# Patient Record
Sex: Male | Born: 1985 | Race: Black or African American | Hispanic: No | Marital: Married | Smoking: Never smoker
Health system: Southern US, Community
[De-identification: ages and names within clinical notes are randomized; demographics above are authoritative.]

## PROBLEM LIST (undated history)

## (undated) DIAGNOSIS — Z87442 Personal history of urinary calculi: Secondary | ICD-10-CM

## (undated) DIAGNOSIS — R519 Headache, unspecified: Secondary | ICD-10-CM

## (undated) DIAGNOSIS — J45909 Unspecified asthma, uncomplicated: Secondary | ICD-10-CM

## (undated) HISTORY — PX: NO PAST SURGERIES: SHX2092

## (undated) HISTORY — DX: Headache, unspecified: R51.9

---

## 2018-09-29 ENCOUNTER — Encounter (HOSPITAL_COMMUNITY): Payer: Self-pay | Admitting: Emergency Medicine

## 2018-09-29 ENCOUNTER — Emergency Department (HOSPITAL_COMMUNITY): Payer: BC Managed Care – PPO

## 2018-09-29 ENCOUNTER — Other Ambulatory Visit: Payer: Self-pay

## 2018-09-29 ENCOUNTER — Emergency Department (HOSPITAL_COMMUNITY)
Admission: EM | Admit: 2018-09-29 | Discharge: 2018-09-30 | Disposition: A | Discharge status: Home / Self Care | Payer: BC Managed Care – PPO | Attending: Emergency Medicine | Admitting: Emergency Medicine

## 2018-09-29 DIAGNOSIS — N201 Calculus of ureter: Secondary | ICD-10-CM | POA: Insufficient documentation

## 2018-09-29 DIAGNOSIS — R109 Unspecified abdominal pain: Secondary | ICD-10-CM | POA: Diagnosis present

## 2018-09-29 MED ORDER — SODIUM CHLORIDE 0.9 % IV BOLUS
1000.0000 mL | Freq: Once | INTRAVENOUS | Status: AC
  Administered 2018-09-29: 1000 mL via INTRAVENOUS

## 2018-09-29 MED ORDER — KETOROLAC TROMETHAMINE 30 MG/ML IJ SOLN
30.0000 mg | Freq: Once | INTRAMUSCULAR | Status: AC
  Administered 2018-09-29: 30 mg via INTRAVENOUS
  Filled 2018-09-29: qty 1

## 2018-09-29 MED ORDER — ONDANSETRON HCL 4 MG/2ML IJ SOLN
4.0000 mg | Freq: Once | INTRAMUSCULAR | Status: AC
  Administered 2018-09-29: 4 mg via INTRAVENOUS
  Filled 2018-09-29: qty 2

## 2018-09-29 MED ORDER — MORPHINE SULFATE (PF) 4 MG/ML IV SOLN
4.0000 mg | Freq: Once | INTRAVENOUS | Status: AC
  Administered 2018-09-29: 23:00:00 4 mg via INTRAVENOUS
  Filled 2018-09-29: qty 1

## 2018-09-29 NOTE — ED Triage Notes (Signed)
Patient from home with c/o severe lower back pain. 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 Describes pain as dull since this morning and now it is sharp and much more painful. No urinary issues. Hx of kidney stones and back spasms

## 2018-09-29 NOTE — ED Provider Notes (Signed)
[name] Provider Note   CSN: [id number] Arrival date & time: 09/29/18  2244    History   Chief Complaint Chief Complaint  Patient presents with  . Back Pain    HPI Seth Vance is a 33 y.o. male with a history of kidney stones and back spasms who presents to the emergency department with a chief complaint of left flank pain.  The patient endorses sudden onset, constant, worsening left flank pain, onset at 20:30.  He characterizes the pain as sharp and crampy.  He has been nauseated and actively started vomiting while I was in the room.  No fever, chills, chest pain, shortness of breath, diarrhea, constipation, diarrhea, or urinary complaints.  No recent falls or injuries.  He took Aleve for his symptoms earlier tonight without improvement.      The history is provided by the patient. No language interpreter was used.    History reviewed. No pertinent past medical history.  There are no active problems to display for this patient.   History reviewed. No pertinent surgical history.      Home Medications    Prior to Admission medications   Medication Sig Start Date End Date Taking? Authorizing Provider  ondansetron (ZOFRAN ODT) 4 MG disintegrating tablet Take 1 tablet (4 mg total) by mouth every 8 (eight) hours as needed. 09/30/18   Camia Dipinto A, PA-C  oxyCODONE-acetaminophen (PERCOCET/ROXICET) 5-325 MG tablet Take 2 tablets by mouth every 6 (six) hours as needed for severe pain. 09/30/18   Aneshia Jacquet A, PA-C  tamsulosin (FLOMAX) 0.4 MG CAPS capsule Take 1 capsule (0.4 mg total) by mouth daily for 14 days. 09/30/18 10/14/18  Keionte Swicegood, Coral ElseMia A, PA-C    Family History History reviewed. No pertinent family history.  Social History Social History   Tobacco Use  . Smoking status: Never Smoker  . Smokeless tobacco: Never Used  Substance Use Topics  . Alcohol use: Yes  . Drug use: Never     Allergies   Patient has no allergy  information on record.   Review of Systems Review of Systems  Constitutional: Negative for appetite change, chills and fever.  Respiratory: Negative for shortness of breath.   Cardiovascular: Negative for chest pain.  Gastrointestinal: Positive for nausea and vomiting. Negative for abdominal pain, constipation and diarrhea.  Genitourinary: Positive for flank pain. Negative for dysuria, frequency, hematuria and urgency.  Musculoskeletal: Negative for back pain.  Skin: Negative for rash.  Allergic/Immunologic: Negative for immunocompromised state.  Neurological: Negative for dizziness, weakness, numbness and headaches.  Psychiatric/Behavioral: Negative for confusion.     Physical Exam Updated Vital Signs BP 127/88   Pulse 62   Temp 97.7 F (36.5 C) (Oral)   Resp 16   Ht 5\' 5"  (1.651 m)   Wt 84.4 kg   SpO2 100%   BMI 30.95 kg/m   Physical Exam Vitals signs and nursing note reviewed.  Constitutional:      Appearance: He is well-developed.  HENT:     Head: Normocephalic.  Eyes:     Conjunctiva/sclera: Conjunctivae normal.  Neck:     Musculoskeletal: Neck supple.  Cardiovascular:     Rate and Rhythm: Normal rate and regular rhythm.     Heart sounds: No murmur.  Pulmonary:     Effort: Pulmonary effort is normal. No respiratory distress.     Breath sounds: No stridor. No wheezing, rhonchi or rales.  Chest:     Chest wall: No tenderness.  Abdominal:  General: There is no distension.     Palpations: Abdomen is soft.     Tenderness: There is abdominal tenderness.     Comments: Tender to palpation over the left CVA, left lower quadrant, and suprapubic region.  No rebound or guarding.  Abdomen soft, nondistended.  Normoactive bowel sounds.  No right CVA tenderness or right-sided abdominal tenderness.   Skin:    General: Skin is warm and dry.     Capillary Refill: Capillary refill takes 2 to 3 seconds.  Neurological:     Mental Status: He is alert.  Psychiatric:         Behavior: Behavior normal.      ED Treatments / Results  Labs (all labs ordered are listed, but only abnormal results are displayed) Labs Reviewed  URINALYSIS, ROUTINE W REFLEX MICROSCOPIC - Abnormal; Notable for the following components:      Result Value   APPearance CLOUDY (*)    Hgb urine dipstick LARGE (*)    Protein, ur 100 (*)    RBC / HPF >50 (*)    All other components within normal limits    EKG None  Radiology Ct Renal Stone Study  Result Date: 09/29/2018 CLINICAL DATA:  Left flank pain EXAM: CT ABDOMEN AND PELVIS WITHOUT CONTRAST TECHNIQUE: Multidetector CT imaging of the abdomen and pelvis was performed following the standard protocol without IV contrast. COMPARISON:  None. FINDINGS: Lower chest: Lung bases are clear. No effusions. Heart is normal size. Hepatobiliary: No focal hepatic abnormality. Gallbladder unremarkable. Pancreas: No focal abnormality or ductal dilatation. Spleen: No focal abnormality.  Normal size. Adrenals/Urinary Tract: 5 mm proximal left ureteral stone with mild left hydronephrosis. No renal or ureteral stones on the right. Adrenal glands and urinary bladder unremarkable. Stomach/Bowel: Normal appendix. Stomach, large and small bowel grossly unremarkable. Vascular/Lymphatic: No evidence of aneurysm or adenopathy. Reproductive: Central prostate calcifications. Other: No free fluid or free air. Musculoskeletal: No acute bony abnormality. IMPRESSION: 5 mm proximal left ureteral stone with left hydronephrosis. Electronically Signed   By: Rolm Baptise M.D.   On: 09/29/2018 23:21    Procedures Procedures (including critical care time)  Medications Ordered in ED Medications  ondansetron (ZOFRAN) injection 4 mg (4 mg Intravenous Given 09/29/18 2304)  morphine 4 MG/ML injection 4 mg (4 mg Intravenous Given 09/29/18 2304)  ketorolac (TORADOL) 30 MG/ML injection 30 mg (30 mg Intravenous Given 09/29/18 2336)  sodium chloride 0.9 % bolus 1,000 mL (0 mLs  Intravenous Stopped 09/30/18 0046)     Initial Impression / Assessment and Plan / ED Course  I have reviewed the triage vital signs and the nursing notes.  Pertinent labs & imaging results that were available during my care of the patient were reviewed by me and considered in my medical decision making (see chart for details).        33 year old male with history of back spasms and kidney stones presenting with left flank pain and nausea.  The patient began actively vomiting while I was in the room.  Zofran and morphine given.  On exam, the patient has a left CVA tenderness and left lower abdominal tenderness.  Will order urinalysis and CT stone study and plan to reassess.  CT with 5 mm proximal left ureteral stone with left hydronephrosis.  -11:32 on reevaluation, patient reports minimal improvement in pain.  Will order Toradol and IV fluids.  UA is pending.  Discussed with the patient that if UA is not concerning for infection and pain is able  to be controlled then we can discharge the patient home with pain medication and urology follow-up.  If urine is concerning for infection or if we are unable to appropriately control the patient's pain that he will likely require a urology consult.  All questions answered and patient is agreeable with plan at this time.  -00:35 patient recheck.  Patient reports pain is currently at a 1 out of 10 and he is very comfortable.  He has been tolerating fluids without vomiting.  He is feeling much better and would like to be discharged.  We will discharge the patient with Flomax, urine strainer, and pain medication.  He is advised to follow-up with urology if stone does not pass in the next 5 to 7 days.  He was also advised if he is unable to control pain at home and he needs to return to the ER [facility] given access to urology services.  A 5820-month prescription history query was performed using the [place] CSRS prior to discharge. All  questions answered.  He is hemodynamically stable and in no acute distress.  Safe for discharge to home with outpatient follow-up.  Final Clinical Impressions(s) / ED Diagnoses   Final diagnoses:  Ureterolithiasis    ED Discharge Orders         Ordered    oxyCODONE-acetaminophen (PERCOCET/ROXICET) 5-325 MG tablet  Every 6 hours PRN     09/30/18 0050    ondansetron (ZOFRAN ODT) 4 MG disintegrating tablet  Every 8 hours PRN     09/30/18 0050    tamsulosin (FLOMAX) 0.4 MG CAPS capsule  Daily     09/30/18 0050           Frederik PearMcDonald, Shell Blanchette A, PA-C 09/30/18 0139    Mesner, Barbara CowerJason, MD 09/30/18 [id number]

## 2018-09-30 LAB — URINALYSIS, ROUTINE W REFLEX MICROSCOPIC
Bacteria, UA: NONE SEEN
Bilirubin Urine: NEGATIVE
Glucose, UA: NEGATIVE mg/dL
Ketones, ur: NEGATIVE mg/dL
Leukocytes,Ua: NEGATIVE
Nitrite: NEGATIVE
Protein, ur: 100 mg/dL — AB
RBC / HPF: >50 RBC/hpf — ABNORMAL HIGH (ref 0–5)
Specific Gravity, Urine: 1.019 (ref 1.005–1.030)
pH: 6 (ref 5.0–8.0)

## 2018-09-30 MED ORDER — OXYCODONE-ACETAMINOPHEN 5-325 MG PO TABS: 2.0000 | ORAL_TABLET | Freq: Four times a day (QID) | ORAL | 0 refills | Status: DC | PRN

## 2018-09-30 MED ORDER — ONDANSETRON 4 MG PO TBDP: 4.0000 mg | ORAL_TABLET | Freq: Three times a day (TID) | ORAL | 0 refills | Status: DC | PRN

## 2018-09-30 MED ORDER — TAMSULOSIN HCL 0.4 MG PO CAPS: 0.4000 mg | ORAL_CAPSULE | Freq: Every day | ORAL | 0 refills | Status: AC

## 2018-09-30 NOTE — Discharge Instructions (Addendum)
Thank you for allowing me to care for you today in the Emergency Department.   Your kidney stone is a 5 mm.  Sometimes stones that are the size can pass on their own, but not always.  If you do not pass the stone within the next 5 to 7 days, please call and schedule a follow-up appointment with [facility].  Take 1 tablet of Flomax daily to help facilitate passing the stone. Stop this medication when the stone passes.   To treat your pain at home, you can take Aleve as directed on the label or 600 mg of ibuprofen with food once every 6 hours.  Do not take both of these medications as they are both NSAIDs.  It is important to eat when you take these medications.  You can also safely take Tylenol with NSAIDs.  You can take 650 mg once every 6 hours.  Do not drink alcohol if you are taking Tylenol.  If your pain is unable to be controlled by this regimen, you can take 1 tablet of Percocet every 6 hours.  Percocet is a narcotic.  It can be addicting.  You should not work or drive while taking this medication.  You should also know the each tablet of Percocet contains 325 mg of Tylenol.  Do not take more than 4000 mg of Tylenol in a 24 hour period.   For nausea and vomiting, you can let 1 tablet of Zofran dissolve in your tongue every 8 hours as needed.  If the stone passes, use the urine strainer to catch it and you can take it to a follow-up appoint with your primary care provider or to [facility].   Return to the emergency department if your pain is uncontrollable, if you develop persistent vomiting despite taking Zofran, if you develop fever or chills.  I would also recommend going to [facility] ER if you develop the symptoms since you have a known kidney stone.

## 2018-09-30 NOTE — ED Notes (Signed)
Patient verbalizes understanding of discharge instructions. Opportunity for questioning and answers were provided. Armband removed by staff, pt discharged from ED.  

## 2018-10-03 ENCOUNTER — Encounter (HOSPITAL_COMMUNITY): Payer: Self-pay | Admitting: Emergency Medicine

## 2018-10-03 ENCOUNTER — Emergency Department (HOSPITAL_COMMUNITY): Payer: BC Managed Care – PPO

## 2018-10-03 ENCOUNTER — Other Ambulatory Visit: Payer: Self-pay

## 2018-10-03 ENCOUNTER — Emergency Department (HOSPITAL_COMMUNITY)
Admission: EM | Admit: 2018-10-03 | Discharge: 2018-10-03 | Disposition: A | Discharge status: Home / Self Care | Payer: BC Managed Care – PPO | Attending: Emergency Medicine | Admitting: Emergency Medicine

## 2018-10-03 DIAGNOSIS — R109 Unspecified abdominal pain: Secondary | ICD-10-CM | POA: Diagnosis present

## 2018-10-03 DIAGNOSIS — N201 Calculus of ureter: Secondary | ICD-10-CM | POA: Diagnosis not present

## 2018-10-03 DIAGNOSIS — N23 Unspecified renal colic: Secondary | ICD-10-CM | POA: Diagnosis not present

## 2018-10-03 NOTE — ED Provider Notes (Signed)
[facility]-EMERGENCY DEPT Provider Note   CSN: [id number] Arrival date & time: 10/03/18  1032    History   Chief Complaint Chief Complaint  Patient presents with  . Flank Pain    HPI Seth Vance is a 33 y.o. male.     HPI   Seth Vance is a 33 y.o. male, with a history of renal stone, presenting to the ED with left flank pain for the last 4 days.  Pain is aching and intermittently sharp/stabbing, moderate to severe, waxing and waning, radiating into the left lower back. Patient was seen in the ED for this same complaint on June 18, diagnosed with left proximal 5 mm ureteral stone with mild hydronephrosis, prescribed Percocet, and told to follow-up with urology. He took 1 Percocet yesterday and 1 this morning around 5:30 AM.  He presents to the ED today because his pain is not reduced as much as he thought it would be.  Denies fever/chills, N/V/D, dysuria, hematuria, difficulty urinating, abdominal pain, or any other complaints.   History reviewed. No pertinent past medical history.  There are no active problems to display for this patient.   History reviewed. No pertinent surgical history.      Home Medications    Prior to Admission medications   Medication Sig Start Date End Date Taking? Authorizing Provider  oxyCODONE-acetaminophen (PERCOCET/ROXICET) 5-325 MG tablet Take 2 tablets by mouth every 6 (six) hours as needed for severe pain. 09/30/18  Yes McDonald, Mia A, PA-C  tamsulosin (FLOMAX) 0.4 MG CAPS capsule Take 1 capsule (0.4 mg total) by mouth daily for 14 days. 09/30/18 10/14/18 Yes McDonald, Mia A, PA-C  ondansetron (ZOFRAN ODT) 4 MG disintegrating tablet Take 1 tablet (4 mg total) by mouth every 8 (eight) hours as needed. Patient not taking: Reported on 10/03/2018 09/30/18   Barkley BoardsMcDonald, Mia A, PA-C    Family History No family history on file.  Social History Social History   Tobacco Use  . Smoking status: Never Smoker  . Smokeless  tobacco: Never Used  Substance Use Topics  . Alcohol use: Yes  . Drug use: Never     Allergies   Shellfish allergy   Review of Systems Review of Systems  Constitutional: Negative for chills and fever.  Respiratory: Negative for shortness of breath.   Cardiovascular: Negative for chest pain.  Gastrointestinal: Negative for abdominal pain, diarrhea, nausea and vomiting.  Genitourinary: Positive for flank pain. Negative for decreased urine volume, difficulty urinating, dysuria, frequency, hematuria, penile pain, scrotal swelling and testicular pain.  Neurological: Negative for weakness.  All other systems reviewed and are negative.    Physical Exam Updated Vital Signs BP 128/82 (BP Location: Right Arm)   Pulse (!) 54   Temp 98.8 F (37.1 C) (Oral)   Resp 18   SpO2 100%   Physical Exam Vitals signs and nursing note reviewed.  Constitutional:      General: He is not in acute distress.    Appearance: He is well-developed. He is not diaphoretic.  HENT:     Head: Normocephalic and atraumatic.     Mouth/Throat:     Mouth: Mucous membranes are moist.     Pharynx: Oropharynx is clear.  Eyes:     Conjunctiva/sclera: Conjunctivae normal.  Neck:     Musculoskeletal: Neck supple.  Cardiovascular:     Rate and Rhythm: Normal rate and regular rhythm.     Pulses: Normal pulses.          Radial  pulses are 2+ on the right side and 2+ on the left side.       Posterior tibial pulses are 2+ on the right side and 2+ on the left side.     Comments: Tactile temperature in the extremities appropriate and equal bilaterally. Pulmonary:     Effort: Pulmonary effort is normal. No respiratory distress.  Abdominal:     Palpations: Abdomen is soft.     Tenderness: There is no abdominal tenderness. There is no right CVA tenderness, left CVA tenderness or guarding.  Musculoskeletal:     Right lower leg: No edema.     Left lower leg: No edema.  Lymphadenopathy:     Cervical: No cervical  adenopathy.  Skin:    General: Skin is warm and dry.  Neurological:     Mental Status: He is alert.  Psychiatric:        Mood and Affect: Mood and affect normal.        Speech: Speech normal.        Behavior: Behavior normal.      ED Treatments / Results  Labs (all labs ordered are listed, but only abnormal results are displayed) Labs Reviewed - No data to display  EKG None  Radiology Dg Abdomen 1 View  Result Date: 10/03/2018 CLINICAL DATA:  Acute left flank pain. EXAM: ABDOMEN - 1 VIEW COMPARISON:  CT scan of September 29, 2018. FINDINGS: The bowel gas pattern is normal. Grossly stable position of proximal left ureteral calculus is noted as noted on prior CT scan. Prostatic calcifications are again noted in the pelvis. IMPRESSION: Grossly stable position of proximal left ureteral calculus. Electronically Signed   By: Marijo Conception M.D.   On: 10/03/2018 13:30    Procedures Procedures (including critical care time)  Medications Ordered in ED Medications - No data to display   Initial Impression / Assessment and Plan / ED Course  I have reviewed the triage vital signs and the nursing notes.  Pertinent labs & imaging results that were available during my care of the patient were reviewed by me and considered in my medical decision making (see chart for details).  Clinical Course as of Oct 03 1426  Mon Oct 03, 2018  1237 Spoke with Dr. Jeffie Pollock, urologist.  States he will work on getting the patient seen in the office this week.  Requested a KUB, but otherwise no other imaging or labs are necessary.   [SJ]    Clinical Course User Index [SJ] Lorayne Bender, PA-C       Patient presents with continued flank pain associated with a left ureteral stone diagnosed June 18. Patient is nontoxic appearing, afebrile, not tachycardic, not tachypneic, not hypotensive, maintains excellent SPO2 on room air, and is in no apparent distress.  In fact, patient appears relaxed and comfortable  lying on the bed, playing on his phone, throughout his ED course. We discussed different dosing techniques for Percocet as well as adding NSAID.  Stone appears to be in grossly similar position on KUB today as it was on CT June 18.  He will follow-up with urology this week. The patient was given instructions for home care as well as return precautions. Patient voices understanding of these instructions, accepts the plan, and is comfortable with discharge.   Final Clinical Impressions(s) / ED Diagnoses   Final diagnoses:  Ureterolithiasis  Ureteral colic    ED Discharge Orders    None       Joy, Shawn  C, PA-C 10/03/18 1431    Virgina NorfolkCuratolo, Adam, DO 10/03/18 2156

## 2018-10-03 NOTE — ED Notes (Signed)
Patient transported to X-ray 

## 2018-10-03 NOTE — ED Triage Notes (Signed)
Pt reports was told last Thursday has kidney stone on left side and to go to [facility] ED for pain control. Pt reports took Percocet around 530am with very little relief. Denies urinary problems at this time.

## 2018-10-03 NOTE — Discharge Instructions (Signed)
°  Kidney Stone There is evidence of a kidney stone on the left side and the position appears to be stable. Some kidney stones can take up to 30 days to pass. Hydration: Hydration is key to helping a kidney stone pass.  Have a goal of half a liter of water every hour or two. Antiinflammatory medications: Take 600 mg of ibuprofen every 6 hours or 440 mg (over the counter dose) to 500 mg (prescription dose) of naproxen every 12 hours for the next 3 days. After this time, these medications may be used as needed for pain. Take these medications with food to avoid upset stomach. Choose only one of these medications, do not take them together. Acetaminophen: Should you continue to have additional pain while taking the ibuprofen or naproxen, you may add in acetaminophen (generic for Tylenol) as needed. Your daily total maximum amount of acetaminophen from all sources should be limited to 4000mg /day for persons without liver problems, or 2000mg /day for those with liver problems. Percocet: May take Percocet (oxycodone-acetaminophen) as needed for severe pain.   Do not drive or perform other dangerous activities while taking this medication as it can cause drowsiness as well as changes in reaction time and judgement.  Please note that each pill of Percocet contains 325 mg of acetaminophen (generic for Tylenol) and the above dosage limits apply. Start with 1 pill every 6 hours.  If 1 pill does not impact pain sufficiently, may take a second Percocet 15 to 20 minutes after the first one.  If effects of the Percocet wear off too quickly, may begin taking 1 pill every 4 hours. Tamsulosin: This medication is designed to help the stone pass.  Take this medication daily until stone passes. Follow-up: Follow-up with the urologist as soon as possible on this matter. Return: Return to the ED for significantly increased pain, difficulty urinating, pain with urination, fever, uncontrolled vomiting, or any other major  concerns.  For prescription assistance, may try using prescription discount sites or apps, such as goodrx.com

## 2018-10-21 ENCOUNTER — Other Ambulatory Visit: Payer: Self-pay | Admitting: Urology

## 2018-10-24 ENCOUNTER — Encounter (HOSPITAL_COMMUNITY): Payer: Self-pay | Admitting: General Practice

## 2018-10-27 ENCOUNTER — Encounter (HOSPITAL_COMMUNITY): Payer: Self-pay | Admitting: *Deleted

## 2018-10-27 ENCOUNTER — Ambulatory Visit (HOSPITAL_COMMUNITY)
Admission: RE | Admit: 2018-10-27 | Discharge: 2018-10-27 | Disposition: A | Discharge status: Home / Self Care | Payer: BC Managed Care – PPO | Attending: Urology | Admitting: Urology

## 2018-10-27 ENCOUNTER — Other Ambulatory Visit: Payer: Self-pay

## 2018-10-27 ENCOUNTER — Encounter (HOSPITAL_COMMUNITY)
Admission: RE | Disposition: A | Discharge status: Home / Self Care | Payer: Self-pay | Source: Home / Self Care | Attending: Urology

## 2018-10-27 ENCOUNTER — Ambulatory Visit (HOSPITAL_COMMUNITY): Payer: BC Managed Care – PPO

## 2018-10-27 DIAGNOSIS — Z87442 Personal history of urinary calculi: Secondary | ICD-10-CM | POA: Diagnosis not present

## 2018-10-27 DIAGNOSIS — F17290 Nicotine dependence, other tobacco product, uncomplicated: Secondary | ICD-10-CM | POA: Insufficient documentation

## 2018-10-27 DIAGNOSIS — N201 Calculus of ureter: Secondary | ICD-10-CM

## 2018-10-27 DIAGNOSIS — N132 Hydronephrosis with renal and ureteral calculous obstruction: Secondary | ICD-10-CM | POA: Diagnosis not present

## 2018-10-27 DIAGNOSIS — J45909 Unspecified asthma, uncomplicated: Secondary | ICD-10-CM | POA: Insufficient documentation

## 2018-10-27 HISTORY — DX: Personal history of urinary calculi: Z87.442

## 2018-10-27 HISTORY — PX: EXTRACORPOREAL SHOCK WAVE LITHOTRIPSY: SHX1557

## 2018-10-27 HISTORY — DX: Unspecified asthma, uncomplicated: J45.909

## 2018-10-27 SURGERY — LITHOTRIPSY, ESWL
Anesthesia: LOCAL | Laterality: Left

## 2018-10-27 MED ORDER — DIPHENHYDRAMINE HCL 25 MG PO CAPS
25.0000 mg | ORAL_CAPSULE | ORAL | Status: AC
  Administered 2018-10-27: 25 mg via ORAL
  Filled 2018-10-27: qty 1

## 2018-10-27 MED ORDER — SODIUM CHLORIDE 0.9 % IV SOLN
INTRAVENOUS | Status: DC
  Administered 2018-10-27: 10:00:00 via INTRAVENOUS

## 2018-10-27 MED ORDER — TRAMADOL HCL 50 MG PO TABS: 50.0000 mg | ORAL_TABLET | Freq: Four times a day (QID) | ORAL | 0 refills | Status: DC | PRN

## 2018-10-27 MED ORDER — DIAZEPAM 5 MG PO TABS
10.0000 mg | ORAL_TABLET | ORAL | Status: AC
  Administered 2018-10-27: 10 mg via ORAL
  Filled 2018-10-27: qty 2

## 2018-10-27 MED ORDER — CIPROFLOXACIN HCL 500 MG PO TABS
500.0000 mg | ORAL_TABLET | ORAL | Status: AC
  Administered 2018-10-27: 500 mg via ORAL
  Filled 2018-10-27: qty 1

## 2018-10-27 NOTE — H&P (Signed)
Acute Kidney Stone  HPI: Seth Vance is a 33 year-old male established patient who is here for further eval and management of kidney stones.  10/21/18: Patient with below noted history. He was diagnosed with 5 mm proximal left ureteral calculus on 6/18. He was started on MET. Today he presents with complaints of progressive lower urinary tract symptoms. He complains of increased urgency and frequency from his baseline. He also notes increased amounts of suprapubic pressure and intermittent pain. He denies any significant left flank or abdominal pain currently, but did have some pain in his left flank about 2-3 days ago. He denies gross hematuria, dysuria, fever, chills, nausea, or vomiting. He is not using tamsulosin currently, as he ran out of this medication.   He was diagnosed with a kidney stone on 09/29/2018. The patient presented to [facility] with symptoms of a kidney stone.   His pain started about 09/29/2018. The pain is on the left side.   Abdomen/Pelvic CT: 09/29/18: 5 mm proximal left ureteral stone, left hydronephrosis. The patient underwent No imaging prior to today's appointment.   The patient relates initially having nausea, vomiting, and flank pain. He is currently having irritative voiding symptoms. He denies having flank pain, back pain, groin pain, nausea, vomiting, fever, and chills. He has been taking Flomax. He has not caught a stone in his urine strainer since his symptoms began.   He has never had surgical treatment for calculi in the past. This is not his first kidney stone. He has had 1 stones prior to getting this one.     ALLERGIES: None   MEDICATIONS: None   GU PSH: None   NON-GU PSH: None   GU PMH: Ureteral calculus - 10/04/2018    NON-GU PMH: Anxiety Asthma Depression    FAMILY HISTORY: 1 Daughter - Daughter 1 son - Son Colon Cancer - Runs in Family Kidney Stones - Father    Notes: Father deceased at age 39   SOCIAL HISTORY: Marital  Status: Married Preferred Language: English; Ethnicity: Not Hispanic Or Latino Current Smoking Status: Patient smokes. Has smoked since 09/12/2014.   Tobacco Use Assessment Completed: Used Tobacco in last 30 days? Does drink.  Drinks 1 caffeinated drink per day. Patient's occupation Customer service manager.     Notes: Patient uses vape cigarettes    REVIEW OF SYSTEMS:    GU Review Male:   Patient reports frequent urination and hard to postpone urination. Patient denies burning/ pain with urination, get up at night to urinate, leakage of urine, stream starts and stops, trouble starting your stream, have to strain to urinate , erection problems, and penile pain.  Gastrointestinal (Upper):   Patient denies nausea, vomiting, and indigestion/ heartburn.  Gastrointestinal (Lower):   Patient denies diarrhea and constipation.  Constitutional:   Patient denies fever, night sweats, weight loss, and fatigue.  Skin:   Patient denies skin rash/ lesion and itching.  Eyes:   Patient denies blurred vision and double vision.  Ears/ Nose/ Throat:   Patient denies sore throat and sinus problems.  Hematologic/Lymphatic:   Patient denies swollen glands and easy bruising.  Cardiovascular:   Patient denies leg swelling and chest pains.  Respiratory:   Patient denies cough and shortness of breath.  Endocrine:   Patient denies excessive thirst.  Musculoskeletal:   Patient denies back pain and joint pain.  Neurological:   Patient denies headaches and dizziness.  Psychologic:   Patient denies depression and anxiety.   VITAL SIGNS:  10/21/2018 08:13 AM  Weight 182 lb / 82.55 kg  Height 65 in / 165.1 cm  BP 118/72 mmHg  Pulse 56 /min  Temperature 98.1 F / 36.7 C  BMI 30.3 kg/m   MULTI-SYSTEM PHYSICAL EXAMINATION:    Constitutional: Well-nourished. No physical deformities. Normally developed. Good grooming.  Respiratory: No labored breathing, no use of accessory muscles.   Cardiovascular: Normal  temperature, normal extremity pulses, no swelling, no varicosities.  Neurologic / Psychiatric: Oriented to time, oriented to place, oriented to person. No depression, no anxiety, no agitation.  Gastrointestinal: No mass, no tenderness, no rigidity, non obese abdomen. No CVAT.  Musculoskeletal: Normal gait and station of head and neck.     PAST DATA REVIEWED:  Source Of History:  Patient  Records Review:   Previous Patient Records  Urine Test Review:   Urinalysis  X-Ray Review: C.T. Abdomen/Pelvis: Reviewed Films. Reviewed Report.     PROCEDURES:         KUB - [id number]  A single view of the abdomen is obtained. No obvious opacities noted within the confines of bilateral renal shadows or along the expected anatomical course of the right ureter. Along the course of the left proximal ureter at the level of the transverse process on L3, there continues to be in approximately 4-5 mm opacity which is consistent with his calculus noted on past imaging studies. This does appear to have progressed, though minimally. Stable pelvic calcification. Normal appearing bowel gas pattern.      Patient confirmed No Neulasta OnPro Device.           Urinalysis w/Scope - 81001 Dipstick Dipstick Cont'd Micro  Color: Yellow Bilirubin: Neg WBC/hpf: 0 - 5/hpf  Appearance: Clear Ketones: Neg RBC/hpf: 0 - 2/hpf  Specific Gravity: 1.030 Blood: Trace Bacteria: NS (Not Seen)  pH: <=5.0 Protein: Neg Cystals: NS (Not Seen)  Glucose: Neg Urobilinogen: 0.2 Casts: NS (Not Seen)    Nitrites: Neg Trichomonas: Not Present    Leukocyte Esterase: Neg Mucous: Not Present      Epithelial Cells: 0 - 5/hpf      Yeast: NS (Not Seen)      Sperm: Not Present    Notes:      ASSESSMENT:      ICD-10 Details  1 GU:   Renal calculus - N20.0    PLAN:            Medications New Meds: Tamsulosin Hcl 0.4 mg capsule 1 capsule PO Daily   #30  0 Refill(s)            Orders X-Rays: KUB  X-Ray Notes: History:  Hematuria:  Yes/No  Patient to see MD after exam: Yes/No  Previous exam: CT / IVP/ US/ KUB/ None  When:  Where:  Diabetic: Yes/ No  BUN/ Creatinine:  Date of last BUN Creatinine:  Weight in pounds:  Allergy- IV Contrast: Yes/ No  Conflicting diabetic meds: Yes/ No  Diabetic Meds:  Prior Authorization #:            Schedule         Document Letter(s):  Created for Patient: Clinical Summary         Notes:   Urinalysis today is without infectious parameters. On KUB imaging, along the course of the left proximal ureter at the level of the transverse process on L3, there continues to be in approximately 4-5 mm opacity which is consistent with his calculus noted on past imaging studies. This does appear to  have progressed, though minimally. We reviewed treatment options in detail today including: Continued observation, ESWL, or ureteroscopy. He seems most interested in potentially proceeding with ESWL. Ultimately I will review today's imaging study with his urologist for recommendations moving forward. He will continue medical expulsive therapy at this time and refill for tamsulosin was sent to his pharmacy. I encouraged that he continue to hydrate and strain his urine. He does have scheduled follow-up on Monday, but will likely plan canceled this appointment. I will discuss with his urologist in keep him updated regarding recommendations moving forward. Strict return precautions reviewed in the interim for fever or progressive pain, nausea, or vomiting.   ** Case and imaging study reviewed with Dr. Louis Meckel. Given minimal progression and progressive symptoms, will plan to schedule for left ESWL. I called and spoke with the patient regarding this recommendation and he is in agreement to move forward with procedure. We reviewed this risks, which are outlined below and he voiced understanding.**   For shockwave lithotripsy I described the risks which include arrhythmia, kidney contusion, kidney  hemorrhage, need for transfusion, long-term risk of diabetes or hypertension, back discomfort, flank ecchymosis, flank abrasion, inability to break up stone, inability to pass stone fragments, Steinstrasse, infection associated with obstructing stones, need for different surgical procedure and possible need for repeat shockwave lithotripsy.

## 2018-10-27 NOTE — Op Note (Signed)
See [facility] OP note scanned into chart. Also because of the size, density, location and other factors that cannot be anticipated I feel this will likely be a staged procedure. This fact supersedes any indication in the scanned [facility] operative note to the contrary.  

## 2018-10-27 NOTE — Discharge Instructions (Signed)
See [facility] discharge instructions in chart.  

## 2018-10-28 ENCOUNTER — Encounter (HOSPITAL_COMMUNITY): Payer: Self-pay | Admitting: Urology

## 2019-04-20 ENCOUNTER — Encounter (HOSPITAL_COMMUNITY): Payer: Self-pay

## 2019-04-20 ENCOUNTER — Emergency Department (HOSPITAL_COMMUNITY)
Admission: EM | Admit: 2019-04-20 | Discharge: 2019-04-20 | Disposition: A | Discharge status: Home / Self Care | Payer: BC Managed Care – PPO | Attending: Emergency Medicine | Admitting: Emergency Medicine

## 2019-04-20 ENCOUNTER — Other Ambulatory Visit: Payer: Self-pay

## 2019-04-20 DIAGNOSIS — Y9389 Activity, other specified: Secondary | ICD-10-CM | POA: Insufficient documentation

## 2019-04-20 DIAGNOSIS — Z8709 Personal history of other diseases of the respiratory system: Secondary | ICD-10-CM | POA: Diagnosis not present

## 2019-04-20 DIAGNOSIS — Y998 Other external cause status: Secondary | ICD-10-CM | POA: Insufficient documentation

## 2019-04-20 DIAGNOSIS — S61213A Laceration without foreign body of left middle finger without damage to nail, initial encounter: Secondary | ICD-10-CM | POA: Insufficient documentation

## 2019-04-20 DIAGNOSIS — Z23 Encounter for immunization: Secondary | ICD-10-CM | POA: Insufficient documentation

## 2019-04-20 DIAGNOSIS — Y929 Unspecified place or not applicable: Secondary | ICD-10-CM | POA: Diagnosis not present

## 2019-04-20 DIAGNOSIS — Z79899 Other long term (current) drug therapy: Secondary | ICD-10-CM | POA: Insufficient documentation

## 2019-04-20 DIAGNOSIS — W268XXA Contact with other sharp object(s), not elsewhere classified, initial encounter: Secondary | ICD-10-CM | POA: Diagnosis not present

## 2019-04-20 DIAGNOSIS — S6992XA Unspecified injury of left wrist, hand and finger(s), initial encounter: Secondary | ICD-10-CM | POA: Diagnosis present

## 2019-04-20 MED ORDER — TETANUS-DIPHTH-ACELL PERTUSSIS 5-2.5-18.5 LF-MCG/0.5 IM SUSP
0.5000 mL | Freq: Once | INTRAMUSCULAR | Status: AC
  Administered 2019-04-20: 0.5 mL via INTRAMUSCULAR
  Filled 2019-04-20: qty 0.5

## 2019-04-20 NOTE — Discharge Instructions (Addendum)
You can remove the splint after the next day or two. It is in place to help the wound begin healing without the tension over your joint. Gently clean it with soap and water daily, pat it dry, and reapply a clean bandage. You can take over-the-counter medication as needed for pain Follow up with your primary care or urgent care for wound recheck as needed. Return to the ER for fever, pus draining from wound, redness, or new or worsening symptoms.

## 2019-04-20 NOTE — ED Triage Notes (Signed)
Pt coming in c/o laceration to the left middle finger that happened last night while building a desk. Bleeding mostly controlled. Unknown tetanus

## 2019-04-20 NOTE — ED Provider Notes (Signed)
[facility]-EMERGENCY DEPT Provider Note   CSN: [id number] Arrival date & time: 04/20/19  2440     History Chief Complaint  Patient presents with  . Laceration    Seth Vance is a 34 y.o. male presenting to the emergency department with finger laceration that occurred yesterday afternoon while building a desk.  He states he accidentally cut his finger with a razor blade when opening a box yesterday afternoon.  Wound is located to the left third digit.  He cleaned and dressed his wound, however whenever he removes the bandage he says it begins to slowly ooze.  No numbness to the digit.  No anticoagulation.  Last tetanus immunization is unknown.  The history is provided by the patient.       Past Medical History:  Diagnosis Date  . Asthma    as a child  . History of kidney stones     There are no problems to display for this patient.   Past Surgical History:  Procedure Laterality Date  . EXTRACORPOREAL SHOCK WAVE LITHOTRIPSY Left 10/27/2018   Procedure: EXTRACORPOREAL SHOCK WAVE LITHOTRIPSY (ESWL);  Surgeon: Crist Fat, MD;  Location: [facility] ORS;  Service: Urology;  Laterality: Left;  . NO PAST SURGERIES         No family history on file.  Social History   Tobacco Use  . Smoking status: Never Smoker  . Smokeless tobacco: Never Used  Substance Use Topics  . Alcohol use: Yes  . Drug use: Never    Home Medications Prior to Admission medications   Medication Sig Start Date End Date Taking? Authorizing Provider  acetaminophen (TYLENOL) 325 MG tablet Take 325 mg by mouth every 6 (six) hours as needed.    [provider]  ondansetron (ZOFRAN ODT) 4 MG disintegrating tablet Take 1 tablet (4 mg total) by mouth every 8 (eight) hours as needed. Patient not taking: Reported on 10/03/2018 09/30/18   McDonald, Pedro Earls A, PA-C  tamsulosin (FLOMAX) 0.4 MG CAPS capsule Take 0.4 mg by mouth.    [provider]  traMADol (ULTRAM) 50 MG tablet  Take 1-2 tablets (50-100 mg total) by mouth every 6 (six) hours as needed for moderate pain. 10/27/18   Crist Fat, MD    Allergies    Shellfish allergy  Review of Systems   Review of Systems  Skin: Positive for wound.  Allergic/Immunologic: Negative for immunocompromised state.  Neurological: Negative for numbness.    Physical Exam Updated Vital Signs BP (!) 138/94 (BP Location: Right Arm)   Pulse (!) 58   Temp 98.3 F (36.8 C) (Oral)   Resp 15   Ht 5\' 5"  (1.651 m)   Wt 86.2 kg   SpO2 100%   BMI 31.62 kg/m   Physical Exam Vitals and nursing note reviewed.  Constitutional:      Appearance: He is well-developed.  HENT:     Head: Normocephalic and atraumatic.  Eyes:     Conjunctiva/sclera: Conjunctivae normal.  Cardiovascular:     Rate and Rhythm: Normal rate.  Pulmonary:     Effort: Pulmonary effort is normal.  Skin:    Comments: 1 cm linear laceration to the radial side of the left third digit at the DIP joint.  Wound slowly oozes when patient ranges the digit, however steady pressure provides hemostasis.  Wound does not appear deep or grossly contaminated.  Normal distal sensation and brisk capillary refill.  Normal full range of motion of the digit.  Neurological:  Mental Status: He is alert.  Psychiatric:        Mood and Affect: Mood normal.        Behavior: Behavior normal.     ED Results / Procedures / Treatments   Labs (all labs ordered are listed, but only abnormal results are displayed) Labs Reviewed - No data to display  EKG None  Radiology No results found.  Procedures Procedures (including critical care time)  Medications Ordered in ED Medications  Tdap (BOOSTRIX) injection 0.5 mL (has no administration in time range)    ED Course  I have reviewed the triage vital signs and the nursing notes.  Pertinent labs & imaging results that were available during my care of the patient were reviewed by me and considered in my medical  decision making (see chart for details).    MDM Rules/Calculators/A&P                     Patient with small laceration to left third digit that occurred greater than 12 hours ago.  Normal range of motion, N/V intact.  Wound has slow oozing bleed when patient ranges the digit though hemostasis is achieved with direct pressure.  Wound does not appear grossly contaminated.  Tdap updated.  Will provide finger splint to aid in hemostasis given patient of wound.  Do not believe wound requires closure given depth of wound and duration of time since occurrence.  Discussed wound care and outpatient follow-up as needed.  Patient is safe for discharge.  Discussed results, findings, treatment and follow up. Patient advised of return precautions. Patient verbalized understanding and agreed with plan.  Final Clinical Impression(s) / ED Diagnoses Final diagnoses:  Laceration of left middle finger without foreign body without damage to nail, initial encounter    Rx / DC Orders ED Discharge Orders    None       Lilyauna Miedema, [place] N, PA-C 04/20/19 5400    Sherwood Gambler, MD 04/20/19 1300

## 2019-04-20 NOTE — ED Notes (Signed)
Non stick dressing applied to laceration, wrapped in gauze, finger splint applied.

## 2020-08-20 IMAGING — CT CT RENAL STONE PROTOCOL
2 of 4 series · 17 of 46 positions shown, 19 images · non-contrast
Comparison: None.

CLINICAL DATA: Left flank pain

EXAM:
CT ABDOMEN AND PELVIS WITHOUT CONTRAST
TECHNIQUE: Multidetector CT imaging of the abdomen and pelvis was performed
following the standard protocol without IV contrast.

[Series 3: ap without · axial · non-contrast · 0.77mm/px · z∈[+711,+1146]mm · 14 of 99 slices shown, 16 images]
[im 6/99  soft-tissue]
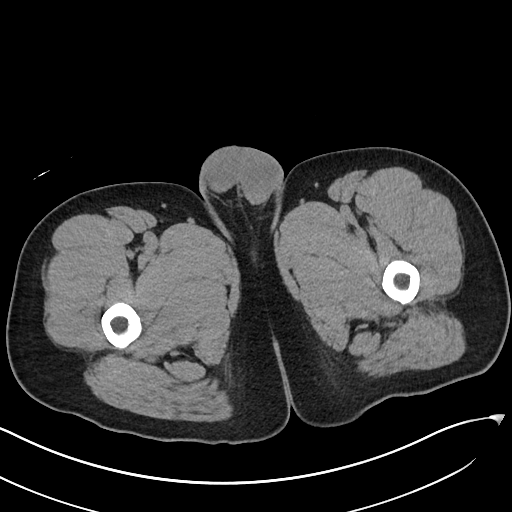
[im 6/99  bone]
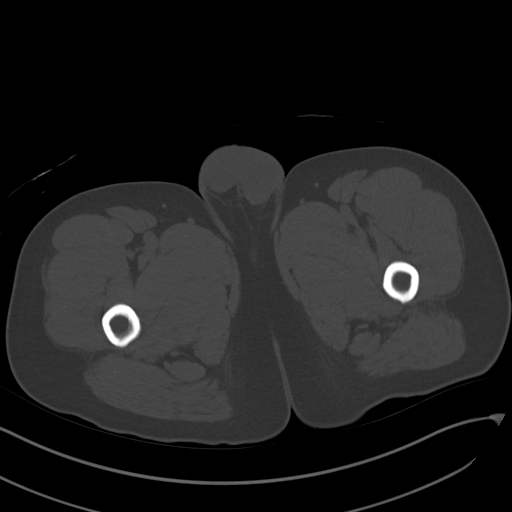
[im 11/99  soft-tissue]
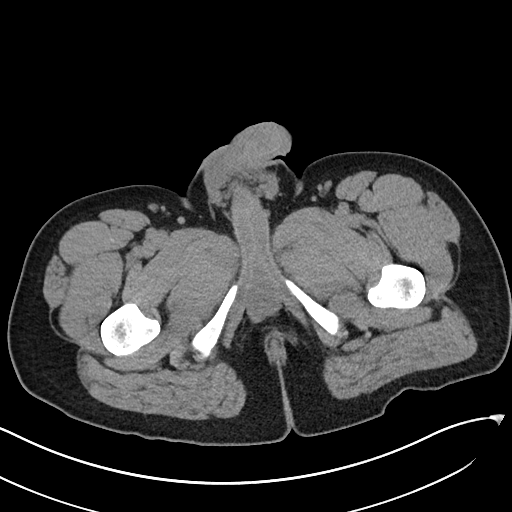
[im 22/99  soft-tissue]
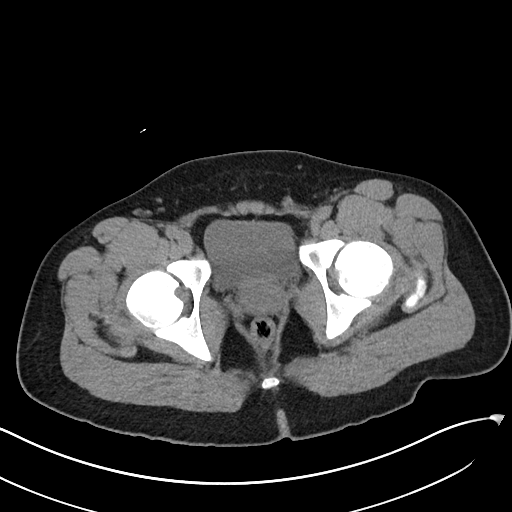
[im 28/99  soft-tissue]
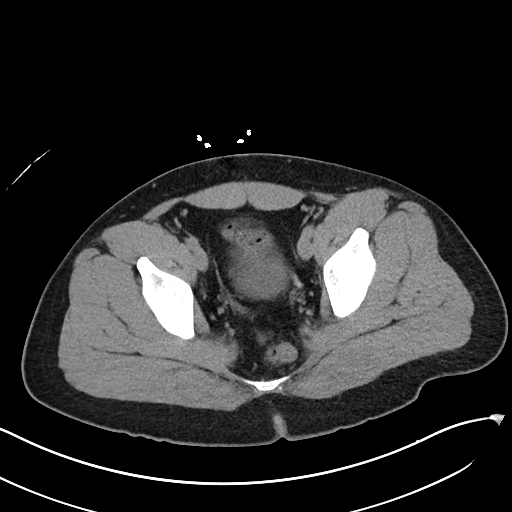
[im 33/99  soft-tissue]
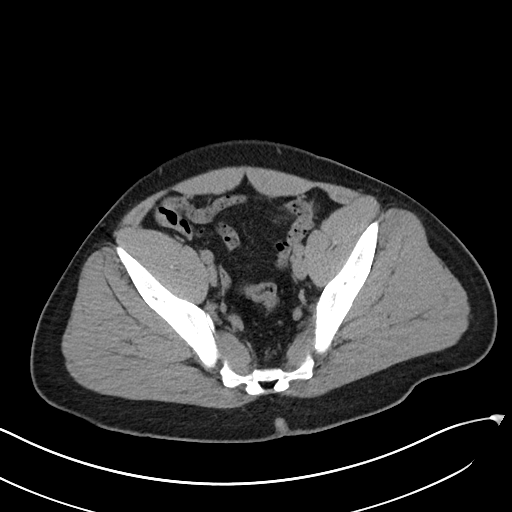
[im 39/99  soft-tissue]
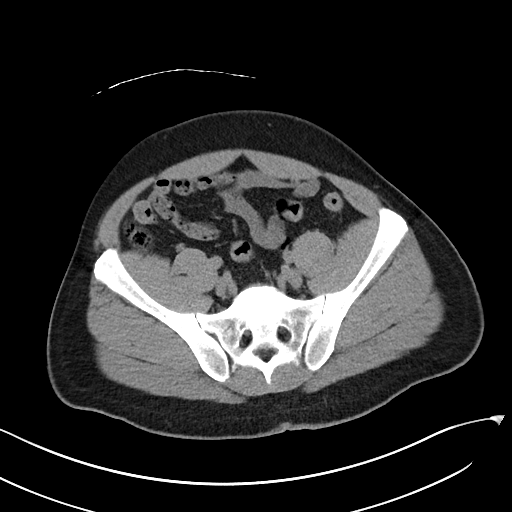
[im 44/99  soft-tissue]
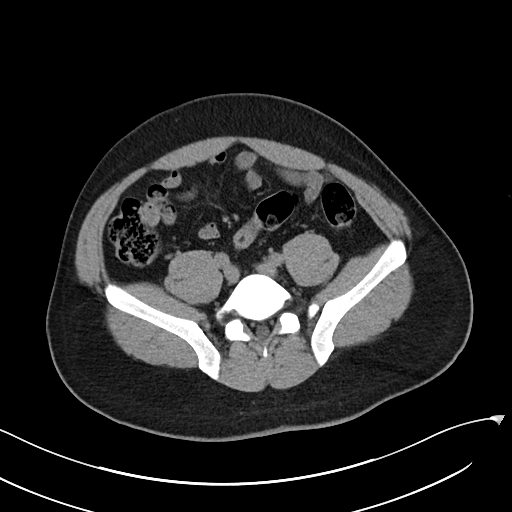
[im 55/99  soft-tissue]
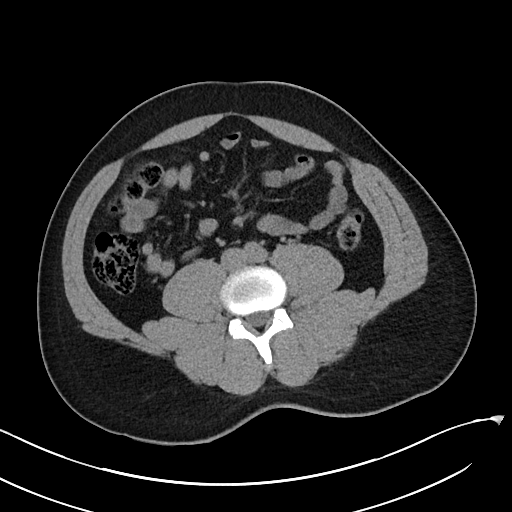
[im 60/99  soft-tissue]
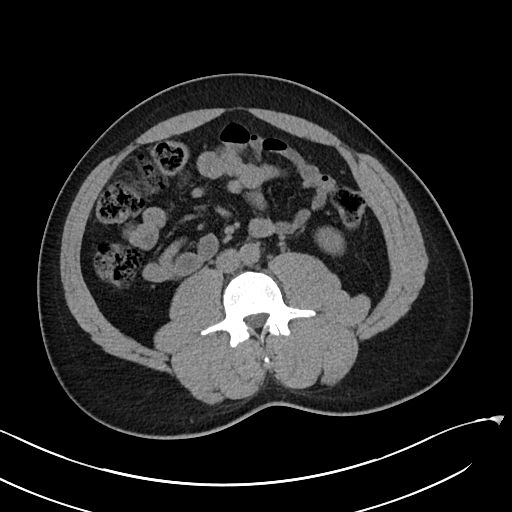
[im 60/99  bone]
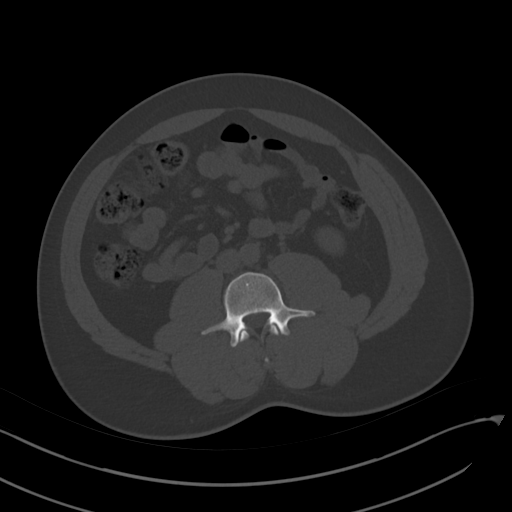
[im 66/99  soft-tissue]
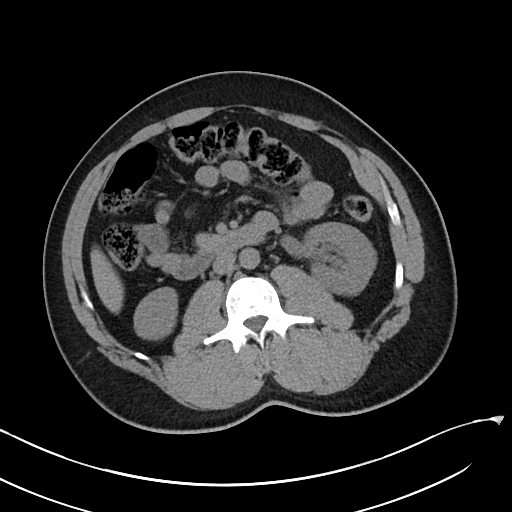
[im 71/99  soft-tissue]
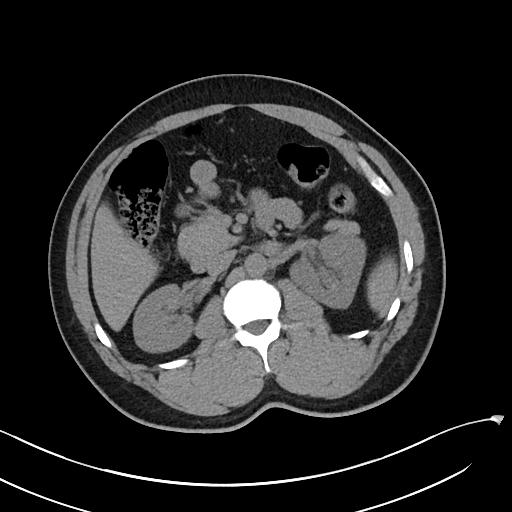
[im 77/99  soft-tissue]
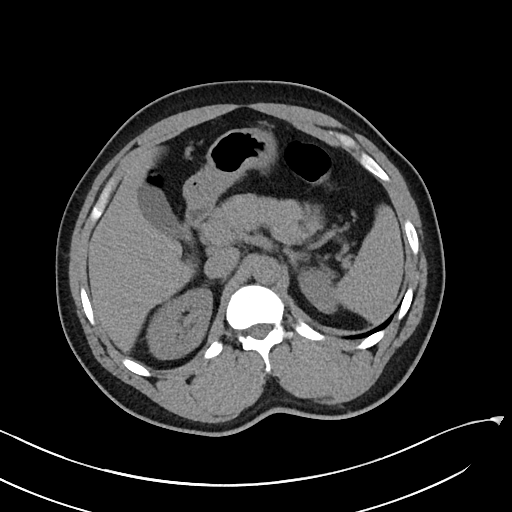
[im 88/99  soft-tissue]
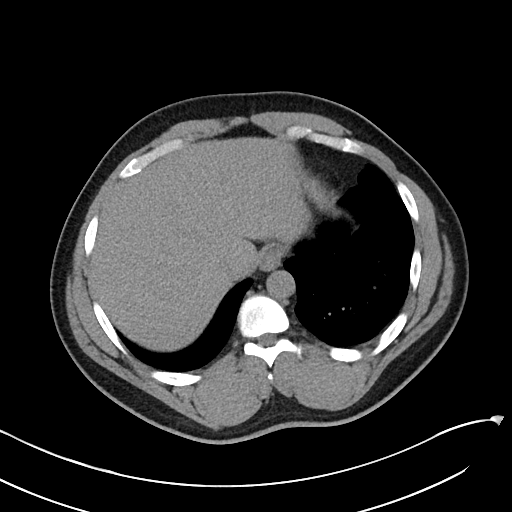
[im 93/99  soft-tissue]
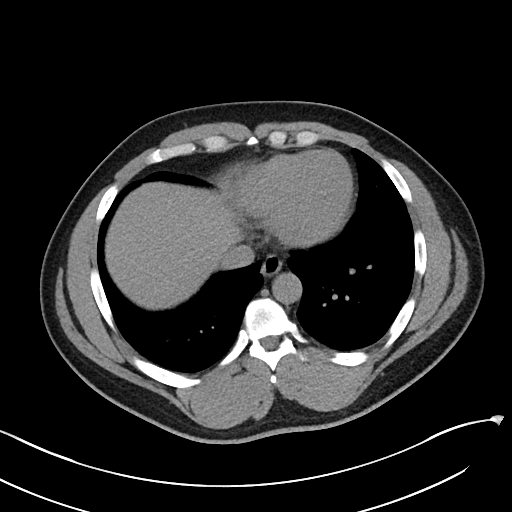

[Series 6: cor · coronal · 0.86mm/px · 3 of 106 slices shown]
[im 36/106  soft-tissue]
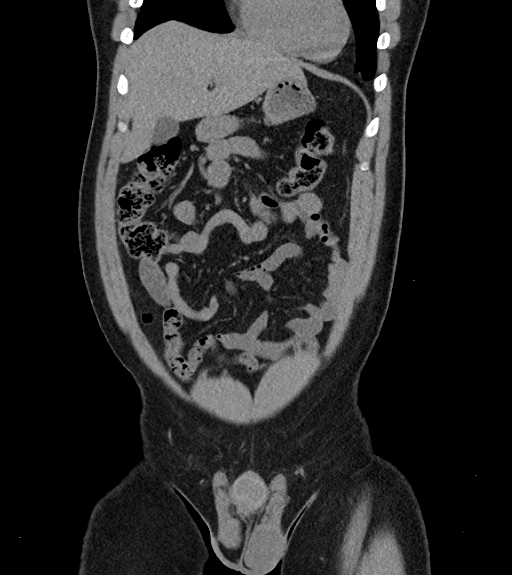
[im 47/106  soft-tissue]
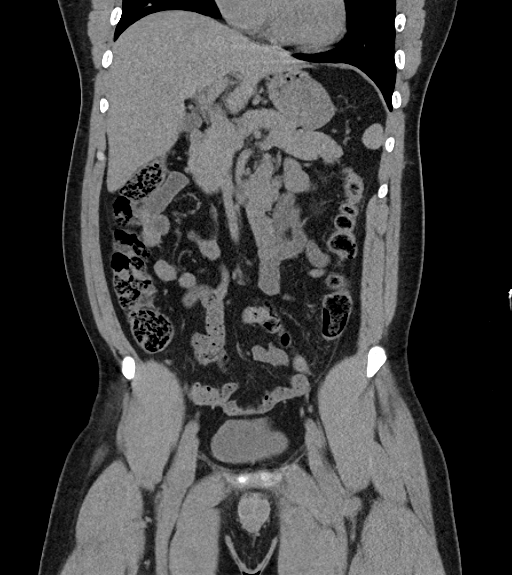
[im 59/106  soft-tissue]
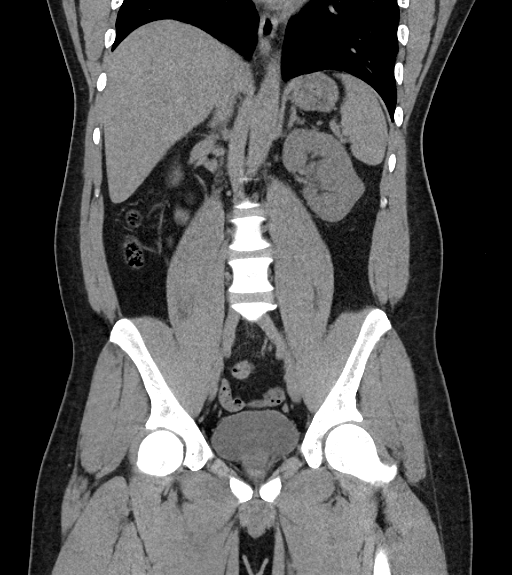

[17 of 46 positions shown; findings below may reference images not displayed]

FINDINGS: Lower chest: Lung bases are clear. No effusions. Heart is normal
size.

Hepatobiliary: No focal hepatic abnormality. Gallbladder
unremarkable.

Pancreas: No focal abnormality or ductal dilatation.

Spleen: No focal abnormality.  Normal size.

Adrenals/Urinary Tract: 5 mm proximal left ureteral stone with mild
left hydronephrosis. No renal or ureteral stones on the right.
Adrenal glands and urinary bladder unremarkable.

Stomach/Bowel: Normal appendix. Stomach, large and small bowel
grossly unremarkable.

Vascular/Lymphatic: No evidence of aneurysm or adenopathy.

Reproductive: Central prostate calcifications.

Other: No free fluid or free air.

Musculoskeletal: No acute bony abnormality.
IMPRESSION: 5 mm proximal left ureteral stone with left hydronephrosis.

## 2020-09-24 ENCOUNTER — Emergency Department (HOSPITAL_COMMUNITY)
Admission: EM | Admit: 2020-09-24 | Discharge: 2020-09-24 | Disposition: A | Discharge status: Home / Self Care | Payer: BC Managed Care – PPO | Attending: Emergency Medicine | Admitting: Emergency Medicine

## 2020-09-24 ENCOUNTER — Encounter (HOSPITAL_COMMUNITY): Payer: Self-pay

## 2020-09-24 ENCOUNTER — Other Ambulatory Visit: Payer: Self-pay

## 2020-09-24 DIAGNOSIS — J45909 Unspecified asthma, uncomplicated: Secondary | ICD-10-CM | POA: Insufficient documentation

## 2020-09-24 DIAGNOSIS — R109 Unspecified abdominal pain: Secondary | ICD-10-CM | POA: Insufficient documentation

## 2020-09-24 DIAGNOSIS — M545 Low back pain, unspecified: Secondary | ICD-10-CM

## 2020-09-24 LAB — URINALYSIS, ROUTINE W REFLEX MICROSCOPIC
Bacteria, UA: NONE SEEN
Bilirubin Urine: NEGATIVE
Glucose, UA: NEGATIVE mg/dL
Ketones, ur: NEGATIVE mg/dL
Leukocytes,Ua: NEGATIVE
Nitrite: NEGATIVE
Protein, ur: NEGATIVE mg/dL
Specific Gravity, Urine: 1.02 (ref 1.005–1.030)
pH: 5 (ref 5.0–8.0)

## 2020-09-24 MED ORDER — IBUPROFEN 600 MG PO TABS: 600.0000 mg | ORAL_TABLET | Freq: Four times a day (QID) | ORAL | 0 refills | Status: DC | PRN

## 2020-09-24 NOTE — ED Provider Notes (Signed)
[facility]-EMERGENCY DEPT Provider Note   CSN: [id number] Arrival date & time: 09/24/20  1307     History Chief Complaint  Patient presents with   Back Pain    Seth Vance is a 35 y.o. male.  35 year old male presents with right-sided flank pain which is now to his left.  History of kidney stones and feels this is similar.  Denies any hematuria or dysuria.  No fever or vomiting.  No treatment use prior to arrival      Past Medical History:  Diagnosis Date   Asthma    as a child   History of kidney stones     There are no problems to display for this patient.   Past Surgical History:  Procedure Laterality Date   EXTRACORPOREAL SHOCK WAVE LITHOTRIPSY Left 10/27/2018   Procedure: EXTRACORPOREAL SHOCK WAVE LITHOTRIPSY (ESWL);  Surgeon: Crist Fat, MD;  Location: [facility];  Service: Urology;  Laterality: Left;   NO PAST SURGERIES         No family history on file.  Social History   Tobacco Use   Smoking status: Never   Smokeless tobacco: Never  Vaping Use   Vaping Use: Every day   Substances: Nicotine, CBD, Flavoring  Substance Use Topics   Alcohol use: Yes   Drug use: Never    Home Medications Prior to Admission medications   Medication Sig Start Date End Date Taking? Authorizing Provider  acetaminophen (TYLENOL) 325 MG tablet Take 325 mg by mouth every 6 (six) hours as needed.    [provider]  ondansetron (ZOFRAN ODT) 4 MG disintegrating tablet Take 1 tablet (4 mg total) by mouth every 8 (eight) hours as needed. Patient not taking: Reported on 10/03/2018 09/30/18   McDonald, Pedro Earls A, PA-C  tamsulosin (FLOMAX) 0.4 MG CAPS capsule Take 0.4 mg by mouth.    [provider]  traMADol (ULTRAM) 50 MG tablet Take 1-2 tablets (50-100 mg total) by mouth every 6 (six) hours as needed for moderate pain. 10/27/18   Crist Fat, MD    Allergies    Shellfish allergy  Review of Systems   Review of Systems  All  other systems reviewed and are negative.  Physical Exam Updated Vital Signs BP (!) 133/92   Pulse (!) 55   Resp 16   SpO2 95%   Physical Exam Vitals and nursing note reviewed.  Constitutional:      General: He is not in acute distress.    Appearance: Normal appearance. He is well-developed. He is not toxic-appearing.  HENT:     Head: Normocephalic and atraumatic.  Eyes:     General: Lids are normal.     Conjunctiva/sclera: Conjunctivae normal.     Pupils: Pupils are equal, round, and reactive to light.  Neck:     Thyroid: No thyroid mass.     Trachea: No tracheal deviation.  Cardiovascular:     Rate and Rhythm: Normal rate and regular rhythm.     Heart sounds: Normal heart sounds. No murmur heard.   No gallop.  Pulmonary:     Effort: Pulmonary effort is normal. No respiratory distress.     Breath sounds: Normal breath sounds. No stridor. No decreased breath sounds, wheezing, rhonchi or rales.  Abdominal:     General: There is no distension.     Palpations: Abdomen is soft.     Tenderness: There is no abdominal tenderness. There is no rebound.  Musculoskeletal:  General: No tenderness. Normal range of motion.     Cervical back: Normal range of motion and neck supple.  Skin:    General: Skin is warm and dry.     Findings: No abrasion or rash.  Neurological:     Mental Status: He is alert and oriented to person, place, and time. Mental status is at baseline.     GCS: GCS eye subscore is 4. GCS verbal subscore is 5. GCS motor subscore is 6.     Cranial Nerves: Cranial nerves are intact. No cranial nerve deficit.     Sensory: No sensory deficit.     Motor: Motor function is intact.  Psychiatric:        Attention and Perception: Attention normal.        Speech: Speech normal.        Behavior: Behavior normal.    ED Results / Procedures / Treatments   Labs (all labs ordered are listed, but only abnormal results are displayed) Labs Reviewed  URINALYSIS, ROUTINE W  REFLEX MICROSCOPIC - Abnormal; Notable for the following components:      Result Value   Hgb urine dipstick SMALL (*)    All other components within normal limits    EKG None  Radiology No results found.  Procedures Procedures   Medications Ordered in ED Medications - No data to display  ED Course  I have reviewed the triage vital signs and the nursing notes.  Pertinent labs & imaging results that were available during my care of the patient were reviewed by me and considered in my medical decision making (see chart for details).    MDM Rules/Calculators/A&P                         Patient with negative UA here.  Offered patient renal CT which she has deferred.  Will treat with NSAIDs and return precautions given  Final Clinical Impression(s) / ED Diagnoses Final diagnoses:  None    Rx / DC Orders ED Discharge Orders     None        Lorre Nick, MD 09/24/20 [phone]

## 2020-09-24 NOTE — ED Triage Notes (Signed)
Patient reports lower back pain hat radiates across since 4am.   4/10 pain   Denies urinary symptoms at this time.   A/Ox4

## 2020-09-24 NOTE — ED Provider Notes (Signed)
Emergency Medicine Provider Triage Evaluation Note  Seth Vance , a 35 y.o. male  was evaluated in triage.  Pt complains of right low back tightness, onset this morning, worse with taking deep breath, feels similar to prior kidney stones. No pain with movement, denies nausea, vomiting, abdominal pain, hematuria, dysuria.  Review of Systems  Positive: Right low back pain Negative: Urinary symptoms  Physical Exam  There were no vitals taken for this visit. Gen:   Awake, no distress   Resp:  Normal effort  MSK:   Moves extremities without difficulty  Other:  No TTP back, no CVA tenderness, abdomen soft and non tender.  Medical Decision Making  Medically screening exam initiated at 2:10 PM.  Appropriate orders placed.  Seth Vance was informed that the remainder of the evaluation will be completed by another provider, this initial triage assessment does not replace that evaluation, and the importance of remaining in the ED until their evaluation is complete.     Jeannie Fend, PA-C 09/24/20 1412    Derwood Kaplan, MD 09/25/20 [phone]

## 2021-04-23 ENCOUNTER — Emergency Department (HOSPITAL_BASED_OUTPATIENT_CLINIC_OR_DEPARTMENT_OTHER): Payer: BC Managed Care – PPO

## 2021-04-23 ENCOUNTER — Encounter (HOSPITAL_BASED_OUTPATIENT_CLINIC_OR_DEPARTMENT_OTHER): Payer: Self-pay | Admitting: Emergency Medicine

## 2021-04-23 ENCOUNTER — Emergency Department (HOSPITAL_BASED_OUTPATIENT_CLINIC_OR_DEPARTMENT_OTHER)
Admission: EM | Admit: 2021-04-23 | Discharge: 2021-04-23 | Disposition: A | Discharge status: Home / Self Care | Payer: BC Managed Care – PPO | Attending: Emergency Medicine | Admitting: Emergency Medicine

## 2021-04-23 ENCOUNTER — Other Ambulatory Visit: Payer: Self-pay

## 2021-04-23 DIAGNOSIS — R519 Headache, unspecified: Secondary | ICD-10-CM | POA: Diagnosis not present

## 2021-04-23 LAB — BASIC METABOLIC PANEL
Anion gap: 6 (ref 5–15)
BUN: 12 mg/dL (ref 6–20)
CO2: 27 mmol/L (ref 22–32)
Calcium: 9.1 mg/dL (ref 8.9–10.3)
Chloride: 104 mmol/L (ref 98–111)
Creatinine, Ser: 0.95 mg/dL (ref 0.61–1.24)
GFR, Estimated: >60 mL/min (ref >60)
Glucose, Bld: 96 mg/dL (ref 70–99)
Potassium: 4 mmol/L (ref 3.5–5.1)
Sodium: 137 mmol/L (ref 135–145)

## 2021-04-23 LAB — CBC WITH DIFFERENTIAL/PLATELET
Abs Immature Granulocytes: 0.01 10*3/uL (ref 0.00–0.07)
Basophils Absolute: 0.1 10*3/uL (ref 0.0–0.1)
Basophils Relative: 1 %
Eosinophils Absolute: 0.2 10*3/uL (ref 0.0–0.5)
Eosinophils Relative: 3 %
HCT: 44.3 % (ref 39.0–52.0)
Hemoglobin: 15.6 g/dL (ref 13.0–17.0)
Immature Granulocytes: 0 %
Lymphocytes Relative: 30 %
Lymphs Abs: 1.8 10*3/uL (ref 0.7–4.0)
MCH: 29.2 pg (ref 26.0–34.0)
MCHC: 35.2 g/dL (ref 30.0–36.0)
MCV: 82.8 fL (ref 80.0–100.0)
Monocytes Absolute: 0.7 10*3/uL (ref 0.1–1.0)
Monocytes Relative: 11 %
Neutro Abs: 3.3 10*3/uL (ref 1.7–7.7)
Neutrophils Relative %: 55 %
Platelets: 298 10*3/uL (ref 150–400)
RBC: 5.35 MIL/uL (ref 4.22–5.81)
RDW: 12.1 % (ref 11.5–15.5)
WBC: 6.1 10*3/uL (ref 4.0–10.5)
nRBC: 0 % (ref 0.0–0.2)

## 2021-04-23 MED ORDER — IOHEXOL 350 MG/ML SOLN
75.0000 mL | Freq: Once | INTRAVENOUS | Status: AC | PRN
  Administered 2021-04-23: 75 mL via INTRAVENOUS

## 2021-04-23 MED ORDER — SODIUM CHLORIDE 0.9 % IV SOLN: INTRAVENOUS | Status: DC

## 2021-04-23 MED ORDER — SODIUM CHLORIDE 0.9 % IV BOLUS
1000.0000 mL | Freq: Once | INTRAVENOUS | Status: AC
  Administered 2021-04-23: 1000 mL via INTRAVENOUS

## 2021-04-23 MED ORDER — KETOROLAC TROMETHAMINE 15 MG/ML IJ SOLN
15.0000 mg | Freq: Once | INTRAMUSCULAR | Status: AC
  Administered 2021-04-23: 15 mg via INTRAVENOUS
  Filled 2021-04-23: qty 1

## 2021-04-23 MED ORDER — METOCLOPRAMIDE HCL 5 MG/ML IJ SOLN
10.0000 mg | Freq: Once | INTRAMUSCULAR | Status: AC
  Administered 2021-04-23: 10 mg via INTRAVENOUS
  Filled 2021-04-23: qty 2

## 2021-04-23 MED ORDER — DIPHENHYDRAMINE HCL 50 MG/ML IJ SOLN
12.5000 mg | Freq: Once | INTRAMUSCULAR | Status: AC
  Administered 2021-04-23: 12.5 mg via INTRAVENOUS
  Filled 2021-04-23: qty 1

## 2021-04-23 NOTE — ED Provider Notes (Signed)
[facility] DEPT Provider Note   CSN: [id number] Arrival date & time: 04/23/21  1541     History  Chief Complaint  Patient presents with   Headache    Seth Vance is a 36 y.o. male.  HPI   Pt is a 36 y/o male who presents to the ED today for eval of a headache that started about 5 days ago.  He reports a h/o a headaches that he refers to as migraines. He typically gets these headaches about once per month for about the last 10 years. He has never seen a neurologist or been on medication for this. HA's usually start at the top of his head and go to the back of his head.   Describes pain as a sharp/aching pain. Rates pain 4/10. He states that the pain seems to wax and wane.   He reports intermittent blurred vision the occurs in the left eye which he states is also typical when he gets a headache. He does not have blurred vision currently.   Reports photosensitivity. Denies sound sensitivity, NV, unilateral numbness/weakness, lightheadedness, dizziness.  Home Medications Prior to Admission medications   Medication Sig Start Date End Date Taking? Authorizing Provider  acetaminophen (TYLENOL) 325 MG tablet Take 325 mg by mouth every 6 (six) hours as needed.    [provider]  ibuprofen (ADVIL) 600 MG tablet Take 1 tablet (600 mg total) by mouth every 6 (six) hours as needed. 09/24/20   Lacretia Leigh, MD  ondansetron (ZOFRAN ODT) 4 MG disintegrating tablet Take 1 tablet (4 mg total) by mouth every 8 (eight) hours as needed. Patient not taking: Reported on 10/03/2018 09/30/18   McDonald, Maree Erie A, PA-C  tamsulosin (FLOMAX) 0.4 MG CAPS capsule Take 0.4 mg by mouth.    [provider]  traMADol (ULTRAM) 50 MG tablet Take 1-2 tablets (50-100 mg total) by mouth every 6 (six) hours as needed for moderate pain. 10/27/18   Ardis Hughs, MD      Allergies    Shellfish allergy    Review of Systems   Review of Systems  Constitutional:  Negative  for fever.  HENT:  Negative for ear pain and sore throat.   Eyes:  Positive for photophobia and visual disturbance (currently resolved). Negative for pain.  Respiratory:  Negative for cough and shortness of breath.   Cardiovascular:  Negative for chest pain.  Gastrointestinal:  Negative for abdominal pain, constipation, diarrhea, nausea and vomiting.  Genitourinary:  Negative for dysuria and hematuria.  Musculoskeletal:  Negative for back pain.  Skin:  Negative for rash.  Neurological:  Positive for headaches. Negative for dizziness, weakness, light-headedness and numbness.  All other systems reviewed and are negative.  Physical Exam Updated Vital Signs BP 116/84    Pulse 60    Temp 98.1 F (36.7 C) (Oral)    Resp 19    Ht 5\' 5"  (1.651 m)    Wt 95.3 kg    SpO2 100%    BMI 34.95 kg/m  Physical Exam Vitals and nursing note reviewed.  Constitutional:      General: He is not in acute distress.    Appearance: He is well-developed.  HENT:     Head: Normocephalic and atraumatic.  Eyes:     Conjunctiva/sclera: Conjunctivae normal.  Cardiovascular:     Rate and Rhythm: Normal rate and regular rhythm.     Heart sounds: No murmur heard. Pulmonary:     Effort: Pulmonary effort is normal. No  respiratory distress.     Breath sounds: Normal breath sounds.  Abdominal:     Palpations: Abdomen is soft.     Tenderness: There is no abdominal tenderness.  Musculoskeletal:        General: No swelling.     Cervical back: Neck supple.  Skin:    General: Skin is warm and dry.     Capillary Refill: Capillary refill takes less than 2 seconds.  Neurological:     Mental Status: He is alert.     Comments: Mental Status:  Alert, thought content appropriate, able to give a coherent history. Speech fluent without evidence of aphasia. Able to follow 2 step commands without difficulty.  Cranial Nerves:  II: pupils equal, round, reactive to light III,IV, VI: ptosis not present, extra-ocular motions intact  bilaterally  V,VII: smile symmetric, facial light touch sensation equal VIII: hearing grossly normal to voice  X: uvula elevates symmetrically  XI: bilateral shoulder shrug symmetric and strong XII: midline tongue extension without fassiculations Motor:  Normal tone. 5/5 strength of BUE and BLE major muscle groups including strong and equal grip strength and dorsiflexion/plantar flexion Sensory: light touch normal in all extremities.   Psychiatric:        Mood and Affect: Mood normal.    ED Results / Procedures / Treatments   Labs (all labs ordered are listed, but only abnormal results are displayed) Labs Reviewed  CBC WITH DIFFERENTIAL/PLATELET  BASIC METABOLIC PANEL    EKG None  Radiology No results found.  Procedures Procedures  \  Medications Ordered in ED Medications  sodium chloride 0.9 % bolus 1,000 mL (0 mLs Intravenous Stopped 04/23/21 2103)    And  0.9 %  sodium chloride infusion (0 mLs Intravenous Stopped 04/23/21 1710)  ketorolac (TORADOL) 15 MG/ML injection 15 mg (15 mg Intravenous Given 04/23/21 1657)  metoCLOPramide (REGLAN) injection 10 mg (10 mg Intravenous Given 04/23/21 1700)  diphenhydrAMINE (BENADRYL) injection 12.5 mg (12.5 mg Intravenous Given 04/23/21 1701)  iohexol (OMNIPAQUE) 350 MG/ML injection 75 mL (75 mLs Intravenous Contrast Given 04/23/21 1832)    ED Course/ Medical Decision Making/ A&P                           Medical Decision Making  36 year old male presenting for evaluation of a headache.  Does have a history of chronic headaches however has never had neurologic follow-up.  Today he is reporting that he has had intermittent left eye visual changes with this headache which has happened in the past but again has never been worked up.  Neurologic exam is grossly reassuring today, will give migraine cocktail and obtain imaging of his brain given his report of blurred vision.  Reviewed/interpreted labs CBC and BMP are reassuring  CTA head  neck reviewed/interpreted -negative for any emergent intracranial abnormality.  No vascular abnormality.  Does have thyroid nodule.  Patient is aware of this and already has scheduled follow-up.  Patient was given a migraine cocktail.  On reassessment he states he feels completely improved.  Has no complaints on reassessment.  We discussed the imaging being reassuring and discussed plan for neurology referral given frequent headaches especially with left eye vision changes.  He also plans to follow-up with his PCP about the thyroid nodule.  Have advised on return precautions.  He voices understanding and is in agreement plan.  All questions answered.  Patient stable for discharge.  Final Clinical Impression(s) / ED Diagnoses Final diagnoses:  Acute nonintractable headache, unspecified headache type    Rx / DC Orders ED Discharge Orders          Ordered    Ambulatory referral to Neurology       Comments: An appointment is requested in approximately: 2 weeks   04/23/21 2138              Rodney Booze, PA-C 04/23/21 2146    Lorelle Gibbs, DO 04/24/21 1534

## 2021-04-23 NOTE — ED Notes (Signed)
Patient transported to CT 

## 2021-04-23 NOTE — ED Triage Notes (Signed)
H/A since SAt am , has taken OTC meds and it has not helped, denies HTN or blood thinners , some blurred vision ,  that comes and goes, no slurred speech  denies weakness in his arms or legs but no dizzyness

## 2021-04-23 NOTE — Discharge Instructions (Signed)
You were given an ambulatory referral to neurologist.  They will reach out to you to schedule an appointment in regards to your frequent headaches.  He can also follow-up with your primary care provider in the meantime to discuss your headaches.  You will also need to get an outpatient ultrasound of your thyroid as we discussed in the emergency department.  Please return the emergency department for any new or worsening symptoms anytime.

## 2021-04-23 NOTE — ED Notes (Signed)
CT tech contact about delay with CT per EDP request. States they will call radiology to clarify any barriers

## 2021-05-19 NOTE — Progress Notes (Signed)
Referring:  Rodney Booze, PA-C [address] [place],  [place] [address]  PCP: Loyola Mast, PA-C  Neurology was asked to evaluate Seth Vance, a 36 year old male for a chief complaint of headaches.  Our recommendations of care will be communicated by shared medical record.    CC:  headaches  HPI:  Medical co-morbidities: anxiety, nephrolithiasis  The patient presents for evaluation of worsening migraines. He has had migraines for several years, but they have increased in severity recently. He typically has migraines 1-2 times per month. They are described as occipital or holocephalic throbbing with associated photophobia and phonophobia. He will get a visual aura described as blurriness and "starlight". Takes ibuprofen which usually helps reduce the pain in 30 minutes to one hour.  Recently he had a severe migraine which lasted for 5 days. He could not get it to break until he went to the ED.   Headache History: Onset: 10 years ago Triggers: wakes up with them Aura: blurriness, starlight in the left eye Location: occipital, holocephalic Quality/Description: throbbing Associated Symptoms:  Photophobia: yes  Phonophobia: yes  Nausea: no Worse with activity?: yes Duration of headaches: 1 hour with treatment  Headache days per month: 5 Headache free days per month: 25  Current Treatment: Abortive ibuprofen  Preventative none  Prior Therapies                                 Ibuprofen tylenol   Headache Risk Factors: Headache risk factors and/or co-morbidities (-) Neck Pain (-) History of Traumatic Brain Injury and/or Concussion - 36 years old  LABS: CBC    Component Value Date/Time   WBC 6.1 04/23/2021 1700   RBC 5.35 04/23/2021 1700   HGB 15.6 04/23/2021 1700   HCT 44.3 04/23/2021 1700   PLT 298 04/23/2021 1700   MCV 82.8 04/23/2021 1700   MCH 29.2 04/23/2021 1700   MCHC 35.2 04/23/2021 1700   RDW 12.1 04/23/2021 1700   LYMPHSABS 1.8 04/23/2021  1700   MONOABS 0.7 04/23/2021 1700   EOSABS 0.2 04/23/2021 1700   BASOSABS 0.1 04/23/2021 1700   CMP Latest Ref Rng & Units 04/23/2021  Glucose 70 - 99 mg/dL 96  BUN 6 - 20 mg/dL 12  Creatinine 0.61 - 1.24 mg/dL 0.95  Sodium 135 - 145 mmol/L 137  Potassium 3.5 - 5.1 mmol/L 4.0  Chloride 98 - 111 mmol/L 104  CO2 22 - 32 mmol/L 27  Calcium 8.9 - 10.3 mg/dL 9.1     IMAGING:  CTH/CTA head/neck 04/23/21: scattered thyroid nodules, otherwise unremarkable  Imaging independently reviewed on May 20, 2021   Current Outpatient Medications on File Prior to Visit  Medication Sig Dispense Refill   acetaminophen (TYLENOL) 325 MG tablet Take 325 mg by mouth every 6 (six) hours as needed.     busPIRone (BUSPAR) 10 MG tablet Take 10 mg by mouth 2 (two) times daily.     ibuprofen (ADVIL) 600 MG tablet Take 1 tablet (600 mg total) by mouth every 6 (six) hours as needed. 30 tablet 0   No current facility-administered medications on file prior to visit.     Allergies: Allergies  Allergen Reactions   Shellfish Allergy Swelling    Family History: Migraine or other headaches in the family:  mom, sister Aneurysms in a first degree relative:  no Brain tumors in the family:  no Other neurological illness in the  family:   no  Past Medical History: Past Medical History:  Diagnosis Date   Asthma    as a child   Headache    History of kidney stones     Past Surgical History Past Surgical History:  Procedure Laterality Date   EXTRACORPOREAL SHOCK WAVE LITHOTRIPSY Left 10/27/2018   Procedure: EXTRACORPOREAL SHOCK WAVE LITHOTRIPSY (ESWL);  Surgeon: Ardis Hughs, MD;  Location: [facility];  Service: Urology;  Laterality: Left;   NO PAST SURGERIES      Social History: Social History   Tobacco Use   Smoking status: Never    Passive exposure: Past   Smokeless tobacco: Never  Vaping Use   Vaping Use: Former   Substances: Nicotine, CBD, Flavoring  Substance Use Topics   Alcohol  use: Yes   Drug use: Never     ROS: Negative for fevers, chills. Positive for headaches. All other systems reviewed and negative unless stated otherwise in HPI.   Physical Exam:   Vital Signs: BP 117/80    Pulse 61    Ht 5\' 5"  (1.651 m)    Wt 205 lb (93 kg)    BMI 34.11 kg/m  GENERAL: well appearing,in no acute distress,alert SKIN:  Color, texture, turgor normal. No rashes or lesions HEAD:  Normocephalic/atraumatic. CV:  RRR RESP: Normal respiratory effort MSK: no tenderness to palpation over occiput, neck, or shoulders  NEUROLOGICAL: Mental Status: Alert, oriented to person, place and time,Follows commands Cranial Nerves: PERRL,visual fields intact to confrontation,extraocular movements intact,facial sensation intact,no facial droop or ptosis,hearing grossly intact,no dysarthria, Motor: muscle strength 5/5 both upper and lower extremities,no drift, normal tone Reflexes: 2+ throughout Sensation: intact to light touch all 4 extremities Coordination: Finger-to- nose-finger intact bilaterally Gait: normal-based   IMPRESSION: 36 year old male with a history of anxiety and nephrolithiasis who presents for evaluation of migraines. His current headache presentation is consistent with episodic migraine with aura. CTH/CTA head and neck were unremarkable. Will start Maxalt for rescue.  PLAN: -Rescue: Start Maxalt 10 mg PRN -next steps: consider preventive medication if headache frequency increases   I spent a total of 19 minutes chart reviewing and counseling the patient. Headache education was done. Discussed treatment options including acute medications. Discussed medication overuse headache and to limit use of acute treatments to no more than 2 days/week or 10 days/month. Discussed medication side effects, adverse reactions and drug interactions. Written educational materials and patient instructions outlining all of the above were given.  Follow-up: 1 year or sooner if  needed   Genia Harold, MD 05/20/2021   11:47 AM

## 2021-05-20 ENCOUNTER — Ambulatory Visit (INDEPENDENT_AMBULATORY_CARE_PROVIDER_SITE_OTHER): Payer: BC Managed Care – PPO | Admitting: Psychiatry

## 2021-05-20 ENCOUNTER — Encounter: Payer: Self-pay | Admitting: Psychiatry

## 2021-05-20 VITALS — BP 117/80 | HR 61 | Ht 65.0 in | Wt 205.0 lb

## 2021-05-20 DIAGNOSIS — G43101 Migraine with aura, not intractable, with status migrainosus: Secondary | ICD-10-CM

## 2021-05-20 MED ORDER — RIZATRIPTAN BENZOATE 10 MG PO TABS: 10.0000 mg | ORAL_TABLET | ORAL | 2 refills | Status: DC | PRN

## 2021-05-20 NOTE — Patient Instructions (Signed)
Start Maxalt as needed for migraines. Take at the onset of migraine. If headache recurs or does not fully resolve, you may take a second dose after 2 hours. Please avoid taking more than 2 days per week

## 2021-10-20 ENCOUNTER — Other Ambulatory Visit: Payer: Self-pay

## 2021-10-20 ENCOUNTER — Encounter (HOSPITAL_COMMUNITY): Payer: Self-pay | Admitting: Emergency Medicine

## 2021-10-20 ENCOUNTER — Inpatient Hospital Stay (HOSPITAL_COMMUNITY)
Admission: EM | Admit: 2021-10-20 | Discharge: 2021-10-22 | DRG: 948 | Disposition: A | Discharge status: Home / Self Care | Payer: BC Managed Care – PPO | Attending: Cardiovascular Disease | Admitting: Cardiovascular Disease

## 2021-10-20 ENCOUNTER — Emergency Department (HOSPITAL_COMMUNITY): Payer: BC Managed Care – PPO

## 2021-10-20 ENCOUNTER — Emergency Department (HOSPITAL_COMMUNITY)
Admission: EM | Admit: 2021-10-20 | Discharge: 2021-10-20 | Disposition: A | Discharge status: Home / Self Care | Payer: BC Managed Care – PPO | Source: Home / Self Care | Attending: Emergency Medicine | Admitting: Emergency Medicine

## 2021-10-20 ENCOUNTER — Telehealth (HOSPITAL_COMMUNITY): Payer: Self-pay

## 2021-10-20 ENCOUNTER — Encounter (HOSPITAL_COMMUNITY): Payer: Self-pay

## 2021-10-20 DIAGNOSIS — Z91013 Allergy to seafood: Secondary | ICD-10-CM

## 2021-10-20 DIAGNOSIS — I214 Non-ST elevation (NSTEMI) myocardial infarction: Secondary | ICD-10-CM | POA: Diagnosis not present

## 2021-10-20 DIAGNOSIS — R079 Chest pain, unspecified: Secondary | ICD-10-CM

## 2021-10-20 DIAGNOSIS — Z8249 Family history of ischemic heart disease and other diseases of the circulatory system: Secondary | ICD-10-CM

## 2021-10-20 DIAGNOSIS — E785 Hyperlipidemia, unspecified: Secondary | ICD-10-CM | POA: Diagnosis present

## 2021-10-20 DIAGNOSIS — R778 Other specified abnormalities of plasma proteins: Secondary | ICD-10-CM | POA: Diagnosis not present

## 2021-10-20 DIAGNOSIS — R0789 Other chest pain: Secondary | ICD-10-CM | POA: Insufficient documentation

## 2021-10-20 DIAGNOSIS — Z79899 Other long term (current) drug therapy: Secondary | ICD-10-CM

## 2021-10-20 DIAGNOSIS — J45909 Unspecified asthma, uncomplicated: Secondary | ICD-10-CM | POA: Insufficient documentation

## 2021-10-20 LAB — CBC
HCT: 46.8 % (ref 39.0–52.0)
HCT: 45.1 % (ref 39.0–52.0)
Hemoglobin: 15.9 g/dL (ref 13.0–17.0)
Hemoglobin: 15.5 g/dL (ref 13.0–17.0)
MCH: 29.3 pg (ref 26.0–34.0)
MCH: 29.3 pg (ref 26.0–34.0)
MCHC: 34 g/dL (ref 30.0–36.0)
MCHC: 34.4 g/dL (ref 30.0–36.0)
MCV: 86.3 fL (ref 80.0–100.0)
MCV: 85.3 fL (ref 80.0–100.0)
Platelets: 307 10*3/uL (ref 150–400)
Platelets: 288 10*3/uL (ref 150–400)
RBC: 5.42 MIL/uL (ref 4.22–5.81)
RBC: 5.29 MIL/uL (ref 4.22–5.81)
RDW: 12 % (ref 11.5–15.5)
RDW: 12 % (ref 11.5–15.5)
WBC: 5.7 10*3/uL (ref 4.0–10.5)
WBC: 6.6 10*3/uL (ref 4.0–10.5)
nRBC: 0 % (ref 0.0–0.2)
nRBC: 0 % (ref 0.0–0.2)

## 2021-10-20 LAB — BASIC METABOLIC PANEL
Anion gap: 8 (ref 5–15)
BUN: 10 mg/dL (ref 6–20)
CO2: 25 mmol/L (ref 22–32)
Calcium: 9.4 mg/dL (ref 8.9–10.3)
Chloride: 104 mmol/L (ref 98–111)
Creatinine, Ser: 0.86 mg/dL (ref 0.61–1.24)
GFR, Estimated: >60 mL/min (ref >60)
Glucose, Bld: 108 mg/dL — ABNORMAL HIGH (ref 70–99)
Potassium: 4.2 mmol/L (ref 3.5–5.1)
Sodium: 137 mmol/L (ref 135–145)

## 2021-10-20 LAB — RAPID URINE DRUG SCREEN, HOSP PERFORMED
Amphetamines: NOT DETECTED
Barbiturates: NOT DETECTED
Benzodiazepines: NOT DETECTED
Cocaine: NOT DETECTED
Opiates: NOT DETECTED
Tetrahydrocannabinol: NOT DETECTED

## 2021-10-20 LAB — HEMOGLOBIN A1C
Hgb A1c MFr Bld: 5.6 % (ref 4.8–5.6)
Mean Plasma Glucose: 114.02 mg/dL

## 2021-10-20 LAB — CREATININE, SERUM
Creatinine, Ser: 1.01 mg/dL (ref 0.61–1.24)
GFR, Estimated: >60 mL/min (ref >60)

## 2021-10-20 LAB — TROPONIN I (HIGH SENSITIVITY)
Troponin I (High Sensitivity): 8 ng/L (ref <18)
Troponin I (High Sensitivity): 92 ng/L — ABNORMAL HIGH (ref <18)
Troponin I (High Sensitivity): 417 ng/L (ref <18)
Troponin I (High Sensitivity): 523 ng/L (ref <18)

## 2021-10-20 LAB — HIV ANTIBODY (ROUTINE TESTING W REFLEX): HIV Screen 4th Generation wRfx: NONREACTIVE

## 2021-10-20 LAB — TSH: TSH: 0.974 u[IU]/mL (ref 0.350–4.500)

## 2021-10-20 MED ORDER — ACETAMINOPHEN 325 MG PO TABS: 650.0000 mg | ORAL_TABLET | ORAL | Status: DC | PRN

## 2021-10-20 MED ORDER — ASPIRIN 81 MG PO TBEC
81.0000 mg | DELAYED_RELEASE_TABLET | Freq: Every day | ORAL | Status: DC
  Administered 2021-10-21 – 2021-10-22 (×2): 81 mg via ORAL
  Filled 2021-10-20 (×2): qty 1

## 2021-10-20 MED ORDER — ATORVASTATIN CALCIUM 10 MG PO TABS
20.0000 mg | ORAL_TABLET | Freq: Every day | ORAL | Status: DC
  Administered 2021-10-20 – 2021-10-22 (×3): 20 mg via ORAL
  Filled 2021-10-20 (×4): qty 2

## 2021-10-20 MED ORDER — METOPROLOL TARTRATE 12.5 MG HALF TABLET
12.5000 mg | ORAL_TABLET | Freq: Two times a day (BID) | ORAL | Status: DC
  Administered 2021-10-20 – 2021-10-22 (×4): 12.5 mg via ORAL
  Filled 2021-10-20 (×4): qty 1

## 2021-10-20 MED ORDER — HEPARIN (PORCINE) 25000 UT/250ML-% IV SOLN
1150.0000 [IU]/h | INTRAVENOUS | Status: DC
  Administered 2021-10-20: 1150 [IU]/h via INTRAVENOUS
  Filled 2021-10-20: qty 250

## 2021-10-20 MED ORDER — ESCITALOPRAM OXALATE 10 MG PO TABS
10.0000 mg | ORAL_TABLET | Freq: Every day | ORAL | Status: DC
  Administered 2021-10-20 – 2021-10-22 (×3): 10 mg via ORAL
  Filled 2021-10-20 (×3): qty 1

## 2021-10-20 MED ORDER — NITROGLYCERIN 0.4 MG SL SUBL: 0.4000 mg | SUBLINGUAL_TABLET | SUBLINGUAL | Status: DC | PRN

## 2021-10-20 MED ORDER — ONDANSETRON HCL 4 MG/2ML IJ SOLN: 4.0000 mg | Freq: Four times a day (QID) | INTRAMUSCULAR | Status: DC | PRN

## 2021-10-20 MED ORDER — HEPARIN SODIUM (PORCINE) 5000 UNIT/ML IJ SOLN
5000.0000 [IU] | Freq: Three times a day (TID) | INTRAMUSCULAR | Status: DC
  Administered 2021-10-20: 5000 [IU] via SUBCUTANEOUS
  Filled 2021-10-20: qty 1

## 2021-10-20 MED ORDER — HEPARIN BOLUS VIA INFUSION
3000.0000 [IU] | Freq: Once | INTRAVENOUS | Status: AC
  Administered 2021-10-20: 3000 [IU] via INTRAVENOUS
  Filled 2021-10-20: qty 3000

## 2021-10-20 NOTE — ED Provider Notes (Addendum)
This is a return patient that I took care of earlier in my shift. Please see prior note.  Briefly, he was seen this morning with complaints of chest pain that started at 9 AM and lasted for about 6 hours and have since resolved.  He was ultimately discharged prior to the final troponin to be processed due to wanting to leave.  Repeat troponin resulted back at 97.  I personally called the patient and asked him to come back to the emergency department and he is here again to be reassessed. Specifically denies any drug use including cocaine.  He is still chest pain free  Physical Exam  BP 105/72   Pulse 62   Temp 97.6 F (36.4 C) (Oral)   Resp 17   Ht 5\' 5"  (1.651 m)   Wt 93 kg   SpO2 98%   BMI 34.11 kg/m   Physical Exam Vitals and nursing note reviewed.  Constitutional:      General: He is not in acute distress.    Appearance: Normal appearance. He is not ill-appearing, toxic-appearing or diaphoretic.  HENT:     Head: Normocephalic and atraumatic.     Nose: No nasal deformity.     Mouth/Throat:     Lips: Pink. No lesions.     Mouth: Mucous membranes are moist. No injury, lacerations, oral lesions or angioedema.     Pharynx: Oropharynx is clear. Uvula midline. No pharyngeal swelling, oropharyngeal exudate, posterior oropharyngeal erythema or uvula swelling.  Eyes:     General: Gaze aligned appropriately. No scleral icterus.       Right eye: No discharge.        Left eye: No discharge.     Conjunctiva/sclera: Conjunctivae normal.     Right eye: Right conjunctiva is not injected. No exudate or hemorrhage.    Left eye: Left conjunctiva is not injected. No exudate or hemorrhage.    Pupils: Pupils are equal, round, and reactive to light.  Cardiovascular:     Rate and Rhythm: Normal rate and regular rhythm.     Pulses: Normal pulses.          Radial pulses are 2+ on the right side and 2+ on the left side.       Dorsalis pedis pulses are 2+ on the right side and 2+ on the left side.      Heart sounds: Normal heart sounds, S1 normal and S2 normal. Heart sounds not distant. No murmur heard.    No friction rub. No gallop. No S3 or S4 sounds.  Pulmonary:     Effort: Pulmonary effort is normal. No accessory muscle usage or respiratory distress.     Breath sounds: Normal breath sounds. No stridor. No wheezing, rhonchi or rales.  Chest:     Chest wall: No tenderness.  Abdominal:     General: Abdomen is flat. There is no distension.     Palpations: Abdomen is soft. There is no mass or pulsatile mass.     Tenderness: There is no abdominal tenderness. There is no guarding or rebound.  Musculoskeletal:     Right lower leg: No edema.     Left lower leg: No edema.  Skin:    General: Skin is warm and dry.     Coloration: Skin is not jaundiced or pale.     Findings: No bruising, erythema, lesion or rash.  Neurological:     General: No focal deficit present.     Mental Status: He is alert and  oriented to person, place, and time.     GCS: GCS eye subscore is 4. GCS verbal subscore is 5. GCS motor subscore is 6.  Psychiatric:        Mood and Affect: Mood normal.        Behavior: Behavior normal. Behavior is cooperative.     Procedures  Procedures  ED Course / MDM   Clinical Course as of 10/20/21 Lawanna Kobus Oct 20, 2021  5320 I spoke with Dr. Jacinto Halim with cardiology.  He request UDS as well as repeat troponin.  He will come by and see the patient. [GL]    Clinical Course User Index [GL] Victorino Dike Finis Bud, PA-C   Medical Decision Making Amount and/or Complexity of Data Reviewed Labs: ordered.  Risk Decision regarding hospitalization.   I have consulted with Dr. Nadara Eaton about his elevated troponin in the setting of chest pain.  He has seen patient and will evaluate for admission.  I have ordered repeat EKG, troponin, and urine drug screen.       Therese Sarah 10/20/21 2115    Therese Sarah 10/20/21 2116    Milagros Loll, MD 10/20/21  [phone]

## 2021-10-20 NOTE — ED Provider Triage Note (Signed)
Emergency Medicine Provider Triage Evaluation Note  Seth Vance , a 36 y.o. male  was evaluated in triage.  Pt complains of chest pain.  Reports it was in the middle of his chest that started at 9 AM and felt like burning.  He thought it was reflux but then it moved to the left side of his chest.  Does have a history of reflux but says this feels different.  No difficulty breathing.  No cough.  Review of Systems  Positive: As above Negative: As above  Physical Exam  BP (!) 133/91 (BP Location: Right Arm)   Pulse (!) 58   Temp 98.2 F (36.8 C)   Resp 15   Ht 5\' 5"  (1.651 m)   Wt 93 kg   SpO2 94%   BMI 34.11 kg/m  Gen:   Awake, no distress   Resp:  Normal effort  MSK:   Moves extremities without difficulty  Other:  RRR, lung sounds clear, well-appearing  Medical Decision Making  Medically screening exam initiated at 10:59 AM.  Appropriate orders placed.  Seth Vance was informed that the remainder of the evaluation will be completed by another provider, this initial triage assessment does not replace that evaluation, and the importance of remaining in the ED until their evaluation is complete.     Ma Hillock, PA-C 10/20/21 1059

## 2021-10-20 NOTE — Progress Notes (Signed)
ANTICOAGULATION CONSULT NOTE - Initial Consult  Pharmacy Consult for Heparin Indication:  NSTEMI  Allergies  Allergen Reactions   Shellfish Allergy Anaphylaxis and Swelling    Patient Measurements: Height: 5\' 5"  (165.1 cm) Weight: 93 kg (205 lb) IBW/kg (Calculated) : 61.5 Heparin Dosing Weight: 82 kg  Vital Signs: Temp: 97.6 F (36.4 C) (07/10 1820) Temp Source: Oral (07/10 1820) BP: 111/75 (07/10 2245) Pulse Rate: 61 (07/10 2245)  Labs: Recent Labs    10/20/21 1100 10/20/21 1518 10/20/21 2002 10/20/21 2126  HGB 15.9  --   --  15.5  HCT 46.8  --   --  45.1  PLT 307  --   --  288  CREATININE 0.86  --   --  1.01  TROPONINIHS 8 92* 417* 523*    Estimated Creatinine Clearance: 105 mL/min (by C-G formula based on SCr of 1.01 mg/dL).   Medical History: Past Medical History:  Diagnosis Date   Asthma    as a child   Headache    History of kidney stones     Medications:  Home meds: rizatriptan prn, escitalopram  Assessment: 36 y.o. M presents with CP. Pt with rising troponin. Plan for heparin gtt and to cath in the morning. No AC PTA. CBC ok on admission. Pt received SQ heparin 5000 units at 2119.  Goal of Therapy:  Heparin level 0.3-0.7 units/ml Monitor platelets by anticoagulation protocol: Yes   Plan:  Heparin IV bolus 3000 units (smaller than normal bolus with SQ recently given) Heparin gtt at 1150 units/hr Will f/u heparin level in 6 hours Daily heparin level and CBC  2120, PharmD, BCPS Please see amion for complete clinical pharmacist phone list 10/20/2021,11:22 PM

## 2021-10-20 NOTE — Discharge Instructions (Signed)
Your labs today were very reassuring. I do not think this is an emergent cause to your symptoms. You should follow up with your PCP.  Please return if symptoms return or worsen.

## 2021-10-20 NOTE — ED Provider Notes (Addendum)
[facility] EMERGENCY DEPARTMENT Provider Note   CSN: [id number] Arrival date & time: 10/20/21  1032     History PMH: Asthma Chief Complaint  Patient presents with   Chest Pain    Seth Vance is a 36 y.o. male.  Chest pain that started around 8 AM this morning while he was sitting at work for his job.  He says it started suddenly and was in the center of his chest.  He describes as a burning sensation and then it radiated to the left side of his chest.  This lasted for about 6 hours and wax and wane in intensity.  It is since resolved and he is completely asymptomatic right now.  States that it felt like heartburn that he has had in the past but lasted longer than normal.  He had no associated symptoms such as shortness of breath, visual disturbance, numbness, weakness, nausea, vomiting, abdominal pain, cough, fever, chills.   Chest Pain Associated symptoms: no abdominal pain, no cough, no fever, no nausea, no numbness, no shortness of breath, no vomiting and no weakness        Home Medications Prior to Admission medications   Medication Sig Start Date End Date Taking? Authorizing Provider  escitalopram (LEXAPRO) 10 MG tablet Take 10 mg by mouth daily. 10/15/21  Yes [provider]  rizatriptan (MAXALT) 10 MG tablet Take 1 tablet (10 mg total) by mouth as needed for migraine. May repeat in 2 hours if needed 05/20/21  Yes Chima, Victorino Dike, MD  busPIRone (BUSPAR) 10 MG tablet Take by mouth. Patient not taking: Reported on 10/20/2021 01/23/21   [provider]      Allergies    Shellfish allergy    Review of Systems   Review of Systems  Constitutional:  Negative for chills and fever.  Eyes:  Negative for visual disturbance.  Respiratory:  Negative for cough, chest tightness and shortness of breath.   Cardiovascular:  Positive for chest pain. Negative for leg swelling.  Gastrointestinal:  Negative for abdominal pain, nausea and vomiting.   Neurological:  Negative for syncope, weakness and numbness.  All other systems reviewed and are negative.   Physical Exam Updated Vital Signs BP 123/76   Pulse (!) 56   Temp 98.2 F (36.8 C) (Oral)   Resp 15   Ht 5\' 5"  (1.651 m)   Wt 93 kg   SpO2 100%   BMI 34.11 kg/m  Physical Exam Vitals and nursing note reviewed.  Constitutional:      General: He is not in acute distress.    Appearance: Normal appearance. He is not ill-appearing, toxic-appearing or diaphoretic.  HENT:     Head: Normocephalic and atraumatic.     Nose: No nasal deformity.     Mouth/Throat:     Lips: Pink. No lesions.     Mouth: Mucous membranes are moist. No injury, lacerations, oral lesions or angioedema.     Pharynx: Oropharynx is clear. Uvula midline. No pharyngeal swelling, oropharyngeal exudate, posterior oropharyngeal erythema or uvula swelling.  Eyes:     General: Gaze aligned appropriately. No scleral icterus.       Right eye: No discharge.        Left eye: No discharge.     Conjunctiva/sclera: Conjunctivae normal.     Right eye: Right conjunctiva is not injected. No exudate or hemorrhage.    Left eye: Left conjunctiva is not injected. No exudate or hemorrhage. Neck:     Vascular: No JVD.  Trachea: No tracheal deviation.  Cardiovascular:     Rate and Rhythm: Normal rate and regular rhythm.     Pulses: Normal pulses.          Radial pulses are 2+ on the right side and 2+ on the left side.       Dorsalis pedis pulses are 2+ on the right side and 2+ on the left side.     Heart sounds: Normal heart sounds, S1 normal and S2 normal. Heart sounds not distant. No murmur heard.    No friction rub. No gallop. No S3 or S4 sounds.  Pulmonary:     Effort: Pulmonary effort is normal. No tachypnea, accessory muscle usage or respiratory distress.     Breath sounds: Normal breath sounds. No stridor. No wheezing, rhonchi or rales.  Chest:     Chest wall: No tenderness.  Abdominal:     General: Abdomen  is flat. There is no distension.     Palpations: Abdomen is soft. There is no mass or pulsatile mass.     Tenderness: There is no abdominal tenderness. There is no guarding or rebound.  Musculoskeletal:     Right lower leg: No edema.     Left lower leg: No edema.  Skin:    General: Skin is warm and dry.     Coloration: Skin is not jaundiced or pale.     Findings: No bruising, erythema, lesion or rash.  Neurological:     General: No focal deficit present.     Mental Status: He is alert and oriented to person, place, and time.     GCS: GCS eye subscore is 4. GCS verbal subscore is 5. GCS motor subscore is 6.  Psychiatric:        Mood and Affect: Mood normal. Mood is not anxious.        Behavior: Behavior normal. Behavior is not agitated. Behavior is cooperative.     ED Results / Procedures / Treatments   Labs (all labs ordered are listed, but only abnormal results are displayed) Labs Reviewed  BASIC METABOLIC PANEL - Abnormal; Notable for the following components:      Result Value   Glucose, Bld 108 (*)    All other components within normal limits  TROPONIN I (HIGH SENSITIVITY) - Abnormal; Notable for the following components:   Troponin I (High Sensitivity) 92 (*)    All other components within normal limits  CBC  TROPONIN I (HIGH SENSITIVITY)    EKG EKG Interpretation  Date/Time:  Monday October 20 2021 10:43:07 EDT Ventricular Rate:  66 PR Interval:  152 QRS Duration: 82 QT Interval:  380 QTC Calculation: 398 R Axis:   27 Text Interpretation: Normal sinus rhythm with sinus arrhythmia Nonspecific T wave abnormality Abnormal ECG No previous ECGs available Confirmed by Marianna Fuss ([id number]) on 10/20/2021 3:36:26 PM  Radiology DG Chest 2 View  Result Date: 10/20/2021 CLINICAL DATA:  CP EXAM: CHEST - 2 VIEW COMPARISON:  None Available. FINDINGS: The heart size and mediastinal contours are within normal limits. Both lungs are clear. The visualized skeletal structures are  unremarkable. IMPRESSION: No active cardiopulmonary disease. Electronically Signed   By: Marjo Bicker M.D.   On: 10/20/2021 11:12    Procedures Procedures  This patient was on telemetry or cardiac monitoring during their time in the ED.    Medications Ordered in ED Medications - No data to display  ED Course/ Medical Decision Making/ A&P  Medical Decision Making   MDM  This is a 36 y.o. male who presents to the ED with chest pain The differential of this patient includes but is not limited to ACS/NSTEMI, Aortic Dissection, Esophageal Rupture, PE, PTX, Tamponade, GERD, MSK, Pericarditis, PNA, and Dermatomal Rash   Initial Impression patient is well-appearing and he has stable vitals.  He is in no acute distress and is completely asymptomatic.  His exam is unremarkable.  I have low suspicion for aortic dissection, esophageal rupture, PE (PERC neg), or tamponade. Initial workup was obtained in triage.   I personally ordered, reviewed, and interpreted all laboratory work and imaging and agree with radiologist interpretation. Results interpreted below: EKG is normal sinus rhythm with nonspecific T wave changes.  No prior to compare to. Chest x-ray without any acute abnormality CBC, BMP are unremarkable.  Troponin is 8.  Assessment/Plan:  Patient is asymptomatic without any intervention at this point.  His EKG with nonspecific t wave changes. Repeat troponin is pending. Patient is requesting discharge because he feels better. I discussed waiting for the final lab result as it was 2-3 hours from time of onset and initial troponin. He wishes to leave at this time.   @1620 , I was alerted that 2nd troponin had increased from 8 to 92. Unfortunately the patient has already been discharged. I have called the patient and asked him to return to the Emergency Department as this could represent an NSTEMI and he may require admission and cardiology consultation. He is on his way  back.      Charting Requirements Additional history is obtained from:  Independent historian External Records from outside source obtained and reviewed including: prior labs Social Determinants of Health:  none Pertinant PMH that complicates patient's illness: n/a  Patient Care Problems that were addressed during this visit: - Nonspecific Chest pain: Acute illness with systemic symptoms This patient was maintained on a cardiac monitor/telemetry. I personally viewed and interpreted the cardiac monitor which reveals an underlying rhythm of NSR Medications given in ED: n/a Reevaluation of the patient after these medicines showed that the patient resolved I have reviewed home medications and made changes accordingly.  Critical Care Interventions: n/a Consultations: n/a Disposition: discharge  This is a supervised visit with my attending physician, Dr. . We have discussed this patient and they have altered the plan as needed.  Portions of this note were generated with Stevie Kern. Dictation errors may occur despite best attempts at proofreading.       Final Clinical Impression(s) / ED Diagnoses Final diagnoses:  Nonspecific chest pain    Rx / DC Orders ED Discharge Orders     None         Scientist, clinical (histocompatibility and immunogenetics), PA-C 10/20/21 1546    12/21/21, PA-C 10/20/21 1635    12/21/21, MD 10/20/21 2329

## 2021-10-20 NOTE — ED Triage Notes (Signed)
Pt here because he was called and told to return due to elevated troponin. Denies CP at this time.

## 2021-10-20 NOTE — ED Notes (Signed)
Pt verbalizes understanding of discharge instructions. Opportunity for questions and answers were provided. Pt discharged from the ED.   ?

## 2021-10-20 NOTE — H&P (Addendum)
Cardiology Admission History and Physical:   Patient ID: Seth Vance MRN: [id number]; DOB: 1985-06-13   Admission date: 10/20/2021  PCP:  Bryon Lions, PA-C   [facility] Providers Cardiologist:  New  Chief Complaint: Chest pain and elevated troponin   Patient Profile:   Seth Vance is a 36 y.o. male with no significant PMH who is being seen 10/20/2021 for the evaluation of Chest pain and elevated troponin.  History of Present Illness:   Seth Vance presents for evaluation of chest pain.  At 9 AM, while at work, he had midsternal burning sensation radiating to his upper jaw and across his chest.  No diaphoresis, nausea or diarrhea.  Symptom persisted and EMS was called.  He was given aspirin 324 mg and brought to ER.  Initial troponin was negative.  Prior to return of delta troponin, he requested discharge.  However second troponin came back 92 and advised to come back to ER.  His symptoms resolved spontaneously around 3 PM.  No reoccurrence.  He works at [employer] center.  Recently he has noted some chest discomfort while talking with customers.  He thought this was due to anxiety.  He was a started weightlifting about a week ago.  He does not has any exertional chest tightness or pressure but reported some shortness of breath which he attributed to overexertion.  He denies palpitation, dizziness, orthopnea, PND, syncope, lower extremity edema or melena.  Denies illicit drug use, alcohol abuse or tobacco smoking.  Brother diagnosed with CHF in his early 47s.  Grandfather had MI. Goes to PCP for yearly checkup.   Past Medical History:  Diagnosis Date   Asthma    as a child   Headache    History of kidney stones     Past Surgical History:  Procedure Laterality Date   EXTRACORPOREAL SHOCK WAVE LITHOTRIPSY Left 10/27/2018   Procedure: EXTRACORPOREAL SHOCK WAVE LITHOTRIPSY (ESWL);  Surgeon: Crist Fat, MD;  Location: [facility];  Service: Urology;  Laterality: Left;    NO PAST SURGERIES       Medications Prior to Admission: Prior to Admission medications   Medication Sig Start Date End Date Taking? Authorizing Provider  busPIRone (BUSPAR) 10 MG tablet Take by mouth. Patient not taking: Reported on 10/20/2021 01/23/21   [provider]  escitalopram (LEXAPRO) 10 MG tablet Take 10 mg by mouth daily. 10/15/21   [provider]  rizatriptan (MAXALT) 10 MG tablet Take 1 tablet (10 mg total) by mouth as needed for migraine. May repeat in 2 hours if needed 05/20/21   Ocie Doyne, MD     Allergies:    Allergies  Allergen Reactions   Shellfish Allergy Anaphylaxis and Swelling    Social History:   Social History   Socioeconomic History   Marital status: Married    Spouse name: Not on file   Number of children: Not on file   Years of education: Not on file   Highest education level: Not on file  Occupational History   Not on file  Tobacco Use   Smoking status: Never    Passive exposure: Past   Smokeless tobacco: Never  Vaping Use   Vaping Use: Former   Substances: Nicotine, CBD, Flavoring  Substance and Sexual Activity   Alcohol use: Yes   Drug use: Never   Sexual activity: Yes    Birth control/protection: Condom  Other Topics Concern   Not on file  Social History Narrative   Right handed  Caffeine minimal use    Lives at home with spouse, kids, and pets   Social Determinants of Health   Financial Resource Strain: Not on file  Food Insecurity: Not on file  Transportation Needs: Not on file  Physical Activity: Not on file  Stress: Not on file  Social Connections: Not on file  Intimate Partner Violence: Not on file    Family History:   The patient's family history includes Arthritis in his mother; Fibromyalgia in his mother; Headache in his mother and sister; Hypertension in his mother.    ROS:  Please see the history of present illness.  All other ROS reviewed and negative.     Physical Exam/Data:    Vitals:   10/20/21 1820 10/20/21 1830 10/20/21 1900 10/20/21 1915  BP:  118/79 105/72 130/86  Pulse:  74 62 90  Resp:  15 17 (!) 22  Temp: 97.6 F (36.4 C)     TempSrc: Oral     SpO2:  100% 98% 98%  Weight:      Height:       No intake or output data in the 24 hours ending 10/20/21 1953    10/20/2021    6:14 PM 10/20/2021   10:41 AM 05/20/2021   11:21 AM  Last 3 Weights  Weight (lbs) 205 lb 205 lb 205 lb  Weight (kg) 92.987 kg 92.987 kg 92.987 kg     Body mass index is 34.11 kg/m.  General:  Well nourished, well developed, in no acute distress HEENT: normal Neck: no JVD Vascular: No carotid bruits; Distal pulses 2+ bilaterally   Cardiac:  normal S1, S2; RRR; no murmur  Lungs:  clear to auscultation bilaterally, no wheezing, rhonchi or rales  Abd: soft, nontender, no hepatomegaly  Ext: no edema Musculoskeletal:  No deformities, BUE and BLE strength normal and equal Skin: warm and dry  Neuro:  CNs 2-12 intact, no focal abnormalities noted Psych:  Normal affect    EKG:  The ECG that was done today was personally reviewed and demonstrates NSR, non   Relevant CV Studies: None  Laboratory Data:  High Sensitivity Troponin:   Recent Labs  Lab 10/20/21 1100 10/20/21 1518  TROPONINIHS 8 92*      Chemistry Recent Labs  Lab 10/20/21 1100  NA 137  K 4.2  CL 104  CO2 25  GLUCOSE 108*  BUN 10  CREATININE 0.86  CALCIUM 9.4  GFRNONAA >60  ANIONGAP 8     Hematology Recent Labs  Lab 10/20/21 1100  WBC 5.7  RBC 5.42  HGB 15.9  HCT 46.8  MCV 86.3  MCH 29.3  MCHC 34.0  RDW 12.0  PLT 307    Radiology/Studies:  DG Chest 2 View  Result Date: 10/20/2021 CLINICAL DATA:  CP EXAM: CHEST - 2 VIEW COMPARISON:  None Available. FINDINGS: The heart size and mediastinal contours are within normal limits. Both lungs are clear. The visualized skeletal structures are unremarkable. IMPRESSION: No active cardiopulmonary disease. Electronically Signed   By: Marjo Bicker  M.D.   On: 10/20/2021 11:12     Assessment and Plan:   Chest pain/elevated troponin -6-hours of burning chest discomfort radiating across his chest and upper jaw.  -Has some nonspecific changes on repeat EKG upon arrival this afternoon - We will continue to trend troponin, if uptrending with EKG changes, consider starting heparin and cardiac catheterization instead of coronary CTA tomorrow. -His symptoms is not pleuritic that concern for pericarditis.  Normal blood pressure rules  out dissection. -Get echocardiogram -He already got aspirin 324 mg by EMS -Start statin -Start low-dose beta-blocker -Discharge medication based on finding -Get urine drug screen   Risk Assessment/Risk Scores:   TIMI Risk Score for Unstable Angina or Non-ST Elevation MI:   The patient's TIMI risk score is 1, which indicates a 5% risk of all cause mortality, new or recurrent myocardial infarction or need for urgent revascularization in the next 14 days.{   Severity of Illness: The appropriate patient status for this patient is OBSERVATION. Observation status is judged to be reasonable and necessary in order to provide the required intensity of service to ensure the patient's safety. The patient's presenting symptoms, physical exam findings, and initial radiographic and laboratory data in the context of their medical condition is felt to place them at decreased risk for further clinical deterioration. Furthermore, it is anticipated that the patient will be medically stable for discharge from the hospital within 2 midnights of admission.    For questions or updates, please contact [facility] Please consult [email or web address] for contact info under     Lorelei Pont, [place]  10/20/2021 7:53 PM    Patient seen and examined and agree with Chelsea Aus, PA as detailed above.  In brief, the patient is a 36 year old healthy male who presented with substernal chest burning found to have trop 8>92 for which  Cardiology was consulted.  Patient states that while at work today, he developed substernal chest burning and tightness while talking on the phone. He initially thought he was having reflux, but his symptoms persisted prompting him to call EMS.   Here, initial trop 8. ECG with nonspecific ST changes. CXR without acute process. His pain subsided after about 6 hours and he left the ER. Repeat trop came back at 49, however, and he was called by the ER to come back for further work-up.  Currently, the patient is chest pain free and comfortable. He denies any preceding exertional symptoms. No drug use. Family history notably for CHF in his brother at age 53 (unknown type).   Overall, symptoms and elevated troponin are concerning for primary cardiac event although he has very few risk factors. Will follow-up repeat trop and if rising, plan for heparin and cath. If stable or lower, will plan for coronary CTA in the AM.  GEN: No acute distress.   Neck: No JVD Cardiac: RRR, no murmurs, rubs, or gallops.  Respiratory: Clear to auscultation bilaterally. GI: Soft, nontender, non-distended  MS: No edema; No deformity. Neuro:  Nonfocal  Psych: Normal affect    Plan: -If repeat troponin rising, plan to start heparin and cath tomorrow -If repeat troponin stable or lower, will plan for coronary CTA -Check TTE -S/p ASA 324mg ; continue ASA 81mg  daily tomorrow -Check lipid panel and A1C for risk stratification -Will adjust medications as needed pending above results  Discussed the plan with the patient and his wife at length. They are amenable for either CTA or cath.   INFORMED CONSENT: I have reviewed the risks, indications, and alternatives to cardiac catheterization, possible angioplasty, and stenting with the patient. Risks include but are not limited to bleeding, infection, vascular injury, stroke, myocardial infection, arrhythmia, kidney injury, radiation-related injury in the case of prolonged  fluoroscopy use, emergency cardiac surgery, and death. The patient understands the risks of serious complication is 1-2 in 1000 with diagnostic cardiac cath and 1-2% or less with angioplasty/stenting.    , MD

## 2021-10-20 NOTE — ED Triage Notes (Signed)
Pt BIB [facility] from work c/o centralized CP that is a burning sensation that started this morning. Pt received 324 of ASA.

## 2021-10-21 ENCOUNTER — Inpatient Hospital Stay (HOSPITAL_COMMUNITY)
Admission: EM | Disposition: A | Discharge status: Home / Self Care | Payer: Self-pay | Source: Home / Self Care | Attending: Cardiovascular Disease

## 2021-10-21 ENCOUNTER — Observation Stay (HOSPITAL_COMMUNITY): Payer: BC Managed Care – PPO

## 2021-10-21 ENCOUNTER — Inpatient Hospital Stay (HOSPITAL_COMMUNITY): Payer: BC Managed Care – PPO

## 2021-10-21 DIAGNOSIS — R778 Other specified abnormalities of plasma proteins: Secondary | ICD-10-CM | POA: Diagnosis present

## 2021-10-21 DIAGNOSIS — Z8249 Family history of ischemic heart disease and other diseases of the circulatory system: Secondary | ICD-10-CM | POA: Diagnosis not present

## 2021-10-21 DIAGNOSIS — E785 Hyperlipidemia, unspecified: Secondary | ICD-10-CM | POA: Diagnosis present

## 2021-10-21 DIAGNOSIS — Z91013 Allergy to seafood: Secondary | ICD-10-CM | POA: Diagnosis not present

## 2021-10-21 DIAGNOSIS — R079 Chest pain, unspecified: Secondary | ICD-10-CM | POA: Diagnosis present

## 2021-10-21 DIAGNOSIS — Z79899 Other long term (current) drug therapy: Secondary | ICD-10-CM | POA: Diagnosis not present

## 2021-10-21 DIAGNOSIS — I214 Non-ST elevation (NSTEMI) myocardial infarction: Secondary | ICD-10-CM | POA: Diagnosis not present

## 2021-10-21 HISTORY — PX: LEFT HEART CATH AND CORONARY ANGIOGRAPHY: CATH118249

## 2021-10-21 LAB — BASIC METABOLIC PANEL
Anion gap: 7 (ref 5–15)
BUN: 13 mg/dL (ref 6–20)
CO2: 26 mmol/L (ref 22–32)
Calcium: 9.1 mg/dL (ref 8.9–10.3)
Chloride: 105 mmol/L (ref 98–111)
Creatinine, Ser: 1.07 mg/dL (ref 0.61–1.24)
GFR, Estimated: >60 mL/min (ref >60)
Glucose, Bld: 106 mg/dL — ABNORMAL HIGH (ref 70–99)
Potassium: 4.2 mmol/L (ref 3.5–5.1)
Sodium: 138 mmol/L (ref 135–145)

## 2021-10-21 LAB — ECHOCARDIOGRAM COMPLETE
Area-P 1/2: 3.12 cm2
Height: 65 in
P 1/2 time: 670 msec
S' Lateral: 4 cm
Weight: 3279.99 oz

## 2021-10-21 LAB — LIPID PANEL
Cholesterol: 189 mg/dL (ref 0–200)
HDL: 32 mg/dL — ABNORMAL LOW (ref >40)
LDL Cholesterol: 120 mg/dL — ABNORMAL HIGH (ref 0–99)
Total CHOL/HDL Ratio: 5.9 RATIO
Triglycerides: 183 mg/dL — ABNORMAL HIGH (ref <150)
VLDL: 37 mg/dL (ref 0–40)

## 2021-10-21 LAB — CBC
HCT: 43.2 % (ref 39.0–52.0)
Hemoglobin: 15.1 g/dL (ref 13.0–17.0)
MCH: 29.5 pg (ref 26.0–34.0)
MCHC: 35 g/dL (ref 30.0–36.0)
MCV: 84.5 fL (ref 80.0–100.0)
Platelets: 278 10*3/uL (ref 150–400)
RBC: 5.11 MIL/uL (ref 4.22–5.81)
RDW: 11.9 % (ref 11.5–15.5)
WBC: 6 10*3/uL (ref 4.0–10.5)
nRBC: 0 % (ref 0.0–0.2)

## 2021-10-21 LAB — HEPARIN LEVEL (UNFRACTIONATED): Heparin Unfractionated: 0.55 IU/mL (ref 0.30–0.70)

## 2021-10-21 SURGERY — LEFT HEART CATH AND CORONARY ANGIOGRAPHY
Anesthesia: LOCAL

## 2021-10-21 MED ORDER — IOHEXOL 350 MG/ML SOLN
INTRAVENOUS | Status: DC | PRN
  Administered 2021-10-21: 75 mL

## 2021-10-21 MED ORDER — SODIUM CHLORIDE 0.9 % WEIGHT BASED INFUSION: 1.0000 mL/kg/h | INTRAVENOUS | Status: DC

## 2021-10-21 MED ORDER — VERAPAMIL HCL 2.5 MG/ML IV SOLN
INTRAVENOUS | Status: DC | PRN
  Administered 2021-10-21: 10 mL via INTRA_ARTERIAL

## 2021-10-21 MED ORDER — FENTANYL CITRATE (PF) 100 MCG/2ML IJ SOLN
INTRAMUSCULAR | Status: DC | PRN
  Administered 2021-10-21: 25 ug via INTRAVENOUS

## 2021-10-21 MED ORDER — FENTANYL CITRATE (PF) 100 MCG/2ML IJ SOLN
INTRAMUSCULAR | Status: AC
  Filled 2021-10-21: qty 2

## 2021-10-21 MED ORDER — LIDOCAINE HCL (PF) 1 % IJ SOLN
INTRAMUSCULAR | Status: AC
  Filled 2021-10-21: qty 30

## 2021-10-21 MED ORDER — HEPARIN SODIUM (PORCINE) 1000 UNIT/ML IJ SOLN
INTRAMUSCULAR | Status: DC | PRN
  Administered 2021-10-21: 4500 [IU] via INTRAVENOUS

## 2021-10-21 MED ORDER — SODIUM CHLORIDE 0.9% FLUSH
3.0000 mL | INTRAVENOUS | Status: DC | PRN
Start: 2021-10-21 — End: 2021-10-22

## 2021-10-21 MED ORDER — HYDRALAZINE HCL 20 MG/ML IJ SOLN: 10.0000 mg | INTRAMUSCULAR | Status: AC | PRN

## 2021-10-21 MED ORDER — ACETAMINOPHEN 325 MG PO TABS: 650.0000 mg | ORAL_TABLET | ORAL | Status: DC | PRN

## 2021-10-21 MED ORDER — HEPARIN (PORCINE) IN NACL 1000-0.9 UT/500ML-% IV SOLN
INTRAVENOUS | Status: AC
  Filled 2021-10-21: qty 500

## 2021-10-21 MED ORDER — LABETALOL HCL 5 MG/ML IV SOLN: 10.0000 mg | INTRAVENOUS | Status: AC | PRN

## 2021-10-21 MED ORDER — SODIUM CHLORIDE 0.9 % WEIGHT BASED INFUSION: 3.0000 mL/kg/h | INTRAVENOUS | Status: DC

## 2021-10-21 MED ORDER — VERAPAMIL HCL 2.5 MG/ML IV SOLN
INTRAVENOUS | Status: AC
  Filled 2021-10-21: qty 2

## 2021-10-21 MED ORDER — HEPARIN (PORCINE) IN NACL 1000-0.9 UT/500ML-% IV SOLN
INTRAVENOUS | Status: DC | PRN
  Administered 2021-10-21 (×2): 500 mL

## 2021-10-21 MED ORDER — SODIUM CHLORIDE 0.9 % IV SOLN: INTRAVENOUS | Status: AC

## 2021-10-21 MED ORDER — SODIUM CHLORIDE 0.9 % IV SOLN: 250.0000 mL | INTRAVENOUS | Status: DC | PRN

## 2021-10-21 MED ORDER — MIDAZOLAM HCL 2 MG/2ML IJ SOLN
INTRAMUSCULAR | Status: AC
  Filled 2021-10-21: qty 2

## 2021-10-21 MED ORDER — SODIUM CHLORIDE 0.9% FLUSH
3.0000 mL | Freq: Two times a day (BID) | INTRAVENOUS | Status: DC
  Administered 2021-10-21: 3 mL via INTRAVENOUS

## 2021-10-21 MED ORDER — MIDAZOLAM HCL 2 MG/2ML IJ SOLN
INTRAMUSCULAR | Status: DC | PRN
  Administered 2021-10-21: 2 mg via INTRAVENOUS

## 2021-10-21 MED ORDER — HEPARIN SODIUM (PORCINE) 1000 UNIT/ML IJ SOLN
INTRAMUSCULAR | Status: AC
  Filled 2021-10-21: qty 10

## 2021-10-21 MED ORDER — ONDANSETRON HCL 4 MG/2ML IJ SOLN: 4.0000 mg | Freq: Four times a day (QID) | INTRAMUSCULAR | Status: DC | PRN

## 2021-10-21 MED ORDER — SODIUM CHLORIDE 0.9% FLUSH
3.0000 mL | Freq: Two times a day (BID) | INTRAVENOUS | Status: DC
  Administered 2021-10-21 – 2021-10-22 (×2): 3 mL via INTRAVENOUS

## 2021-10-21 SURGICAL SUPPLY — 10 items
BAND ZEPHYR COMPRESS 30 LONG (HEMOSTASIS) ×1 IMPLANT
CATH 5FR JL3.5 JR4 ANG PIG MP (CATHETERS) ×1 IMPLANT
GLIDESHEATH SLEND SS 6F .021 (SHEATH) ×1 IMPLANT
GUIDEWIRE INQWIRE 1.5J.035X260 (WIRE) IMPLANT
INQWIRE 1.5J .035X260CM (WIRE) ×2
KIT HEART LEFT (KITS) ×2 IMPLANT
PACK CARDIAC CATHETERIZATION (CUSTOM PROCEDURE TRAY) ×2 IMPLANT
SHEATH PROBE COVER 6X72 (BAG) ×1 IMPLANT
TRANSDUCER W/STOPCOCK (MISCELLANEOUS) ×2 IMPLANT
TUBING CIL FLEX 10 FLL-RA (TUBING) ×2 IMPLANT

## 2021-10-21 NOTE — H&P (View-Only) (Signed)
Primary Cardiologist:  [place]  Subjective:  Denies SSCP, palpitations or Dyspnea Discussed cath today with elevated troponin   Objective:  Vitals:   10/21/21 0500 10/21/21 0515 10/21/21 0530 10/21/21 0545  BP: 114/80 112/80 117/81 111/81  Pulse: (!) 52 (!) 56 (!) 56 (!) 57  Resp: 12 15 15 15  Temp:      TempSrc:      SpO2: 97% 96% 95% 96%  Weight:      Height:        Intake/Output from previous day: No intake or output data in the 24 hours ending 10/21/21 0852  Physical Exam: Affect appropriate Healthy:  appears stated age HEENT: normal Neck supple with no adenopathy JVP normal no bruits no thyromegaly Lungs clear with no wheezing and good diaphragmatic motion Heart:  S1/S2 no murmur, no rub, gallop or click PMI normal Abdomen: benighn, BS positve, no tenderness, no AAA no bruit.  No HSM or HJR Distal pulses intact with no bruits No edema Neuro non-focal Skin warm and dry No muscular weakness   Lab Results: Basic Metabolic Panel: Recent Labs    10/20/21 1100 10/20/21 2126 10/21/21 0500  NA 137  --  138  K 4.2  --  4.2  CL 104  --  105  CO2 25  --  26  GLUCOSE 108*  --  106*  BUN 10  --  13  CREATININE 0.86 1.01 1.07  CALCIUM 9.4  --  9.1   Liver Function Tests: No results for input(s): "AST", "ALT", "ALKPHOS", "BILITOT", "PROT", "ALBUMIN" in the last 72 hours. No results for input(s): "LIPASE", "AMYLASE" in the last 72 hours. CBC: Recent Labs    10/20/21 2126 10/21/21 0500  WBC 6.6 6.0  HGB 15.5 15.1  HCT 45.1 43.2  MCV 85.3 84.5  PLT 288 278    Hemoglobin A1C: Recent Labs    10/20/21 2126  HGBA1C 5.6   Fasting Lipid Panel: Recent Labs    10/21/21 0500  CHOL 189  HDL 32*  LDLCALC 120*  TRIG 183*  CHOLHDL 5.9   Thyroid Function Tests: Recent Labs    10/20/21 2126  TSH 0.974     Imaging: DG Chest 2 View  Result Date: 10/20/2021 CLINICAL DATA:  CP EXAM: CHEST - 2 VIEW COMPARISON:  None Available. FINDINGS: The heart  size and mediastinal contours are within normal limits. Both lungs are clear. The visualized skeletal structures are unremarkable. IMPRESSION: No active cardiopulmonary disease. Electronically Signed   By: Amar  Amaresh M.D.   On: 10/20/2021 11:12    Cardiac Studies:  ECG: SR rate 58 low voltage no acute ST changes    Telemetry:  NSR 10/21/2021   Echo: pending   Medications:    aspirin EC  81 mg Oral Daily   atorvastatin  20 mg Oral Daily   escitalopram  10 mg Oral Daily   metoprolol tartrate  12.5 mg Oral BID   sodium chloride flush  3 mL Intravenous Q12H      heparin 1,150 Units/hr (10/21/21 0630)    Assessment/Plan:   SEMI:  ? Chest pain with positive family history Troponin rising 92-> 523 no acute ECG changes continue ASA/statin, beta blocker and heparin Shared decision making favor diagnostic cath today Risks including stroke , MI, surgery bleeding and intubation discussed willing to proceed Good right radial pulse Orders written on schedule If no CAD may need cardiac MRI to r/o myocarditis   Maize Brittingham 10/21/2021, 8:52 AM    

## 2021-10-21 NOTE — Progress Notes (Signed)
  Echocardiogram 2D Echocardiogram has been performed.  Leta Jungling M 10/21/2021, 2:54 PM

## 2021-10-21 NOTE — Progress Notes (Signed)
ANTICOAGULATION CONSULT NOTE - Follow Up Consult  Pharmacy Consult for heparin Indication:  NSTEMI  Labs: Recent Labs    10/20/21 1100 10/20/21 1518 10/20/21 2002 10/20/21 2126 10/21/21 0500  HGB 15.9  --   --  15.5 15.1  HCT 46.8  --   --  45.1 43.2  PLT 307  --   --  288 278  HEPARINUNFRC  --   --   --   --  0.55  CREATININE 0.86  --   --  1.01 1.07  TROPONINIHS 8 92* 417* 523*  --     Assessment/Plan:  35yo male therapeutic on heparin with initial dosing for NSTEMI. Will continue infusion at current rate of 1150 units/hr and confirm stable with additional level.   Vernard Gambles, PharmD, BCPS  10/21/2021,6:32 AM

## 2021-10-21 NOTE — ED Notes (Signed)
Provider at bedside

## 2021-10-21 NOTE — Progress Notes (Signed)
Primary Cardiologist:  Shari Prows  Subjective:  Denies SSCP, palpitations or Dyspnea Discussed cath today with elevated troponin   Objective:  Vitals:   10/21/21 0500 10/21/21 0515 10/21/21 0530 10/21/21 0545  BP: 114/80 112/80 117/81 111/81  Pulse: (!) 52 (!) 56 (!) 56 (!) 57  Resp: 12 15 15 15   Temp:      TempSrc:      SpO2: 97% 96% 95% 96%  Weight:      Height:        Intake/Output from previous day: No intake or output data in the 24 hours ending 10/21/21 0852  Physical Exam: Affect appropriate Healthy:  appears stated age HEENT: normal Neck supple with no adenopathy JVP normal no bruits no thyromegaly Lungs clear with no wheezing and good diaphragmatic motion Heart:  S1/S2 no murmur, no rub, gallop or click PMI normal Abdomen: benighn, BS positve, no tenderness, no AAA no bruit.  No HSM or HJR Distal pulses intact with no bruits No edema Neuro non-focal Skin warm and dry No muscular weakness   Lab Results: Basic Metabolic Panel: Recent Labs    10/20/21 1100 10/20/21 2126 10/21/21 0500  NA 137  --  138  K 4.2  --  4.2  CL 104  --  105  CO2 25  --  26  GLUCOSE 108*  --  106*  BUN 10  --  13  CREATININE 0.86 1.01 1.07  CALCIUM 9.4  --  9.1   Liver Function Tests: No results for input(s): "AST", "ALT", "ALKPHOS", "BILITOT", "PROT", "ALBUMIN" in the last 72 hours. No results for input(s): "LIPASE", "AMYLASE" in the last 72 hours. CBC: Recent Labs    10/20/21 2126 10/21/21 0500  WBC 6.6 6.0  HGB 15.5 15.1  HCT 45.1 43.2  MCV 85.3 84.5  PLT 288 278    Hemoglobin A1C: Recent Labs    10/20/21 2126  HGBA1C 5.6   Fasting Lipid Panel: Recent Labs    10/21/21 0500  CHOL 189  HDL 32*  LDLCALC 120*  TRIG 183*  CHOLHDL 5.9   Thyroid Function Tests: Recent Labs    10/20/21 2126  TSH 0.974     Imaging: DG Chest 2 View  Result Date: 10/20/2021 CLINICAL DATA:  CP EXAM: CHEST - 2 VIEW COMPARISON:  None Available. FINDINGS: The heart  size and mediastinal contours are within normal limits. Both lungs are clear. The visualized skeletal structures are unremarkable. IMPRESSION: No active cardiopulmonary disease. Electronically Signed   By: 12/21/2021 M.D.   On: 10/20/2021 11:12    Cardiac Studies:  ECG: SR rate 58 low voltage no acute ST changes    Telemetry:  NSR 10/21/2021   Echo: pending   Medications:    aspirin EC  81 mg Oral Daily   atorvastatin  20 mg Oral Daily   escitalopram  10 mg Oral Daily   metoprolol tartrate  12.5 mg Oral BID   sodium chloride flush  3 mL Intravenous Q12H      heparin 1,150 Units/hr (10/21/21 0630)    Assessment/Plan:   SEMI:  ? Chest pain with positive family history Troponin rising 92-> 523 no acute ECG changes continue ASA/statin, beta blocker and heparin Shared decision making favor diagnostic cath today Risks including stroke , MI, surgery bleeding and intubation discussed willing to proceed Good right radial pulse Orders written on schedule If no CAD may need cardiac MRI to r/o myocarditis   12/22/21 10/21/2021, 8:52 AM

## 2021-10-21 NOTE — Interval H&P Note (Signed)
Cath Lab Visit (complete for each Cath Lab visit)  Clinical Evaluation Leading to the Procedure:   ACS: Yes.    Non-ACS:    Anginal Classification: CCS IV  Anti-ischemic medical therapy: Minimal Therapy (1 class of medications)  Non-Invasive Test Results: No non-invasive testing performed  Prior CABG: No previous CABG      History and Physical Interval Note:  10/21/2021 10:32 AM  Seth Vance  has presented today for surgery, with the diagnosis of nstemi.  The various methods of treatment have been discussed with the patient and family. After consideration of risks, benefits and other options for treatment, the patient has consented to  Procedure(s): LEFT HEART CATH AND CORONARY ANGIOGRAPHY (N/A) as a surgical intervention.  The patient's history has been reviewed, patient examined, no change in status, stable for surgery.  I have reviewed the patient's chart and labs.  Questions were answered to the patient's satisfaction.     Lance Muss

## 2021-10-21 NOTE — Progress Notes (Signed)
TR BAND REMOVAL  LOCATION:    Right ulner  DEFLATED PER PROTOCOL:    Yes.    TIME BAND OFF / DRESSING APPLIED:    1330pm a clean dry dressing applied with gauze and tegaderm   SITE UPON ARRIVAL:    Level 0  SITE AFTER BAND REMOVAL:    Level 0  CIRCULATION SENSATION AND MOVEMENT:    Within Normal Limits   Yes.    COMMENTS:   Care instruction given to patient.

## 2021-10-22 ENCOUNTER — Other Ambulatory Visit (HOSPITAL_COMMUNITY): Payer: Self-pay

## 2021-10-22 ENCOUNTER — Inpatient Hospital Stay (HOSPITAL_COMMUNITY): Payer: BC Managed Care – PPO

## 2021-10-22 ENCOUNTER — Encounter (HOSPITAL_COMMUNITY): Payer: Self-pay | Admitting: Interventional Cardiology

## 2021-10-22 ENCOUNTER — Telehealth: Payer: Self-pay | Admitting: Cardiovascular Disease

## 2021-10-22 DIAGNOSIS — R778 Other specified abnormalities of plasma proteins: Secondary | ICD-10-CM | POA: Diagnosis not present

## 2021-10-22 DIAGNOSIS — R079 Chest pain, unspecified: Secondary | ICD-10-CM | POA: Diagnosis not present

## 2021-10-22 DIAGNOSIS — I214 Non-ST elevation (NSTEMI) myocardial infarction: Secondary | ICD-10-CM

## 2021-10-22 LAB — CBC
HCT: 42.5 % (ref 39.0–52.0)
Hemoglobin: 14.9 g/dL (ref 13.0–17.0)
MCH: 29.5 pg (ref 26.0–34.0)
MCHC: 35.1 g/dL (ref 30.0–36.0)
MCV: 84.2 fL (ref 80.0–100.0)
Platelets: 305 10*3/uL (ref 150–400)
RBC: 5.05 MIL/uL (ref 4.22–5.81)
RDW: 11.9 % (ref 11.5–15.5)
WBC: 5.9 10*3/uL (ref 4.0–10.5)
nRBC: 0 % (ref 0.0–0.2)

## 2021-10-22 LAB — LIPOPROTEIN A (LPA): Lipoprotein (a): 38.7 nmol/L — ABNORMAL HIGH (ref <75.0)

## 2021-10-22 MED ORDER — METOPROLOL TARTRATE 25 MG PO TABS
12.5000 mg | ORAL_TABLET | Freq: Two times a day (BID) | ORAL | 3 refills | Status: AC
  Filled 2021-10-22: qty 30, 30d supply, fill #0

## 2021-10-22 MED ORDER — GADOBUTROL 1 MMOL/ML IV SOLN
10.0000 mL | Freq: Once | INTRAVENOUS | Status: AC | PRN
  Administered 2021-10-22: 10 mL via INTRAVENOUS

## 2021-10-22 MED ORDER — ATORVASTATIN CALCIUM 20 MG PO TABS
20.0000 mg | ORAL_TABLET | Freq: Every day | ORAL | 3 refills | Status: AC
  Filled 2021-10-22: qty 30, 30d supply, fill #0

## 2021-10-22 MED ORDER — ASPIRIN 81 MG PO TBEC
81.0000 mg | DELAYED_RELEASE_TABLET | Freq: Every day | ORAL | 3 refills | Status: AC
  Filled 2021-10-22: qty 90, 90d supply, fill #0

## 2021-10-22 MED FILL — Lidocaine HCl Local Preservative Free (PF) Inj 1%: INTRAMUSCULAR | Qty: 30 | Status: AC

## 2021-10-22 NOTE — Discharge Summary (Signed)
Discharge Summary    Patient ID: Seth Vance MRN: [id number]; DOB: [birth year]  Admit date: 10/20/2021 Discharge date: 10/22/2021  PCP:  Bryon Lions, PA-C   [facility] Providers Cardiologist:  Charlton Haws, MD     Discharge Diagnoses    Principal Problem:   NSTEMI (non-ST elevated myocardial infarction) [facility]) Active Problems:   Elevated troponin   Chest pain    Diagnostic Studies/Procedures    [facility] 10/21/21    The left ventricular systolic function is normal.   LV end diastolic pressure is normal.   The left ventricular ejection fraction is 55-65% by visual estimate.   There is no aortic valve stenosis.   No angiographically apparent coronary artery disease.   Continue preventive therapy.  Plan for cardiac MRI to evaluate for myocarditis.   Echocardiogram 10/21/21 1. Left ventricular ejection fraction, by estimation, is 55 to 60%. Left  ventricular ejection fraction by 3D volume is 53 %. The left ventricle has  normal function. The left ventricle has no regional wall motion  abnormalities. There is mild concentric  left ventricular hypertrophy. Left ventricular diastolic parameters were  normal.   2. Right ventricular systolic function is normal. The right ventricular  size is normal. There is normal pulmonary artery systolic pressure.   3. The mitral valve is normal in structure. Trivial mitral valve  regurgitation.   4. The aortic valve is tricuspid. Aortic valve regurgitation is mild to  moderate. No aortic stenosis is present.   5. The inferior vena cava is normal in size with greater than 50%  respiratory variability, suggesting right atrial pressure of 3 mmHg.   Cardiac MRI 10/22/21  1. Normal LV size and function EF 54%   2. No delayed gadolinium uptake and normal T2 44 msec No evidence of myocarditis   3.  Normal RV size and function   4.  No significant pericardial effusion   5.  Mild AR   6.  Normal ascending thoracic aorta 2.8 cm    _____________   History of Present Illness     Seth Vance is a 36 y.o. male without significant past medical history who presented to the ED on 7/10 for evaluation of chest pain. Patient reported that on the morning of admission, patient developed midsternal burning sensation that radiated to his upper jaw and across his chest.  Denied any diaphoresis, nausea, diarrhea.  His symptoms persisted and he called EMS.  He was given aspirin 324 mg and brought to the ER.  Initial troponin was negative.  Prior to return of delta troponin, patient did request to be discharged.  However, second troponin came back at 92 and he was advised to come back to ER.  His symptoms had spontaneously resolved around 3 PM and there was no recurrence.  Patient works at [employer] center.  Recently, he had noted some chest discomfort while talking with customers.  He thought that this was due to anxiety.  He also had started weightlifting about a week prior.  Denied having any exertional chest tightness or pressure, but did report some shortness of breath which he attributed to overexertion.  Patient denied palpitations, dizziness, orthopnea, PND, syncope, lower extremity edema, melena.  Also denied any illicit drug use, alcohol abuse, tobacco smoking.  Hospital Course     Consultants: None    Chest Pain  Elevated Troponin  - Patient presented to the ED complaining of about 6 hours of burning chest discomfort that radiated across his chest and to his  upper jaw  - No acute EKG changes  - hsTn 8>>92>>417>>523  - Underwent LHC as above with no apparent coronary artery disease  - Echo as above with EF 55-60%, mild LVH, normal RV systolic function, no effusion  - Underwent cardiac MRI as above with normal LV size and function, no evidence of myocarditis, normal RV size and function, no pericardial effusion, mild AR  - Continue daily ASA, statin, metoprolol  - Patient has a follow up on 7/21 to ensure resolution of  symptoms  - Patient also has a 3 month follow up appointment with Dr. Eden Emms for following of AR as seen on cardiac MRI   HLD  - HDL 32, LDL 120, triglycerides 183 this admission - Started lipitor this admission - Needs LFTs and lipid panel in 6-8 weeks   Did the patient have an acute coronary syndrome (MI, NSTEMI, STEMI, etc) this admission?:  No                               Did the patient have a percutaneous coronary intervention (stent / angioplasty)?:  No.    Patient was seen and examined by Dr. Eden Emms and was deemed stable for discharge.   Patient has a follow up appointment on 7/21. Also has a 3 month follow up with Dr. Eden Emms for AR     _____________  Discharge Vitals Blood pressure 115/75, pulse 64, temperature 98.8 F (37.1 C), temperature source Oral, resp. rate 17, height  (1.651 m), weight 93 kg, SpO2 97 %.  Filed Weights   10/20/21 1814  Weight: 93 kg    Labs & Radiologic Studies    CBC Recent Labs    10/21/21 0500 10/22/21 0318  WBC 6.0 5.9  HGB 15.1 14.9  HCT 43.2 42.5  MCV 84.5 84.2  PLT 278 305   Basic Metabolic Panel Recent Labs    16/10/96 1100 10/20/21 2126 10/21/21 0500  NA 137  --  138  K 4.2  --  4.2  CL 104  --  105  CO2 25  --  26  GLUCOSE 108*  --  106*  BUN 10  --  13  CREATININE 0.86 1.01 1.07  CALCIUM 9.4  --  9.1   Liver Function Tests No results for input(s): "AST", "ALT", "ALKPHOS", "BILITOT", "PROT", "ALBUMIN" in the last 72 hours. No results for input(s): "LIPASE", "AMYLASE" in the last 72 hours. High Sensitivity Troponin:   Recent Labs  Lab 10/20/21 1100 10/20/21 1518 10/20/21 2002 10/20/21 2126  TROPONINIHS 8 92* 417* 523*    BNP Invalid input(s): "POCBNP" D-Dimer No results for input(s): "DDIMER" in the last 72 hours. Hemoglobin A1C Recent Labs    10/20/21 2126  HGBA1C 5.6   Fasting Lipid Panel Recent Labs    10/21/21 0500  CHOL 189  HDL 32*  LDLCALC 120*  TRIG 183*  CHOLHDL 5.9   Thyroid  Function Tests Recent Labs    10/20/21 2126  TSH 0.974   _____________  MR CARDIAC VELOCITY FLOW MAP  Result Date: 10/22/2021 CLINICAL DATA:  R/O Myocarditis EXAM: CARDIAC MRI TECHNIQUE: The patient was scanned on a 1.5 Tesla GE magnet. A dedicated cardiac coil was used. Functional imaging was done using Fiesta sequences. 2,3, and 4 chamber views were done to assess for RWMA's. Modified Simpson's rule using a short axis stack was used to calculate an ejection fraction on a dedicated work Secondary school teacher  software. The patient received 8 cc of Gadavist. After 10 minutes inversion recovery sequences were used to assess for infiltration and scar tissue. CONTRAST:  Gadavist FINDINGS: Normal atrial sizes. No LAA thrombus No ASD/PFO Prominent epicardial adipose tissue no significant pericardial effusion Normal ascending thoracic aorta 2.8 cm Tri leaflet AV with mild appearing AR. Normal MV, TV and PV. Normal RVOT. Normal LV size and function no LVH septal thickness 7 mm. Quantitative EF 54% (EDV 131 cc ESV 60 cc SV 71 cc) Normal RV size and function No delayed gadolinium uptake No scar, infarction or infiltration Normal parametric measures T1 956 msec T2 44 msec and ECV 28% IMPRESSION: 1. Normal LV size and function EF 54% 2. No delayed gadolinium uptake and normal T2 44 msec No evidence of myocarditis 3.  Normal RV size and function 4.  No significant pericardial effusion 5.  Mild AR 6.  Normal ascending thoracic aorta 2.8 cm Charlton Haws Electronically Signed   By: Charlton Haws M.D.   On: 10/22/2021 09:46   CARDIAC CATHETERIZATION  Addendum Date: 10/21/2021     The left ventricular systolic function is normal.   LV end diastolic pressure is normal.   The left ventricular ejection fraction is 55-65% by visual estimate.   There is no aortic valve stenosis.   No angiographically apparent coronary artery disease. Continue preventive therapy.  Plan for cardiac MRI to evaluate for myocarditis. Results  discussed with the patient's wife.  [phone].  Result Date: 10/21/2021   The left ventricular systolic function is normal.   LV end diastolic pressure is normal.   The left ventricular ejection fraction is 55-65% by visual estimate.   There is no aortic valve stenosis.   No angiographically apparent coronary artery disease. Continue preventive therapy.  Plan for cardiac MRI to evaluate for myocarditis. Results discussed with the patient's wife.  [phone].   ECHOCARDIOGRAM COMPLETE  Result Date: 10/21/2021    ECHOCARDIOGRAM REPORT   Patient Name:   Seth Vance Date of Exam: 10/21/2021 Medical Rec #:  [id number]     Height:       65.0 in Accession #:    [id number]    Weight:       205.0 lb Date of Birth:  [birth year]     BSA:          1.999 m Patient Age:    35 years      BP:           127/74 mmHg Patient Gender: M             HR:           68 bpm. Exam Location:  Inpatient Procedure: 2D Echo, 3D Echo, Cardiac Doppler and Color Doppler Indications:    Chest Pain R07.9  History:        Patient has no prior history of Echocardiogram examinations.  Sonographer:    Leta Jungling RDCS Referring Phys: [id number] Manson Passey IMPRESSIONS  1. Left ventricular ejection fraction, by estimation, is 55 to 60%. Left ventricular ejection fraction by 3D volume is 53 %. The left ventricle has normal function. The left ventricle has no regional wall motion abnormalities. There is mild concentric left ventricular hypertrophy. Left ventricular diastolic parameters were normal.  2. Right ventricular systolic function is normal. The right ventricular size is normal. There is normal pulmonary artery systolic pressure.  3. The mitral valve is normal in structure. Trivial mitral valve regurgitation.  4. The aortic  valve is tricuspid. Aortic valve regurgitation is mild to moderate. No aortic stenosis is present.  5. The inferior vena cava is normal in size with greater than 50% respiratory variability, suggesting right atrial  pressure of 3 mmHg. FINDINGS  Left Ventricle: Left ventricular ejection fraction, by estimation, is 55 to 60%. Left ventricular ejection fraction by 3D volume is 53 %. The left ventricle has normal function. The left ventricle has no regional wall motion abnormalities. The left ventricular internal cavity size was normal in size. There is mild concentric left ventricular hypertrophy. Left ventricular diastolic parameters were normal. Normal left ventricular filling pressure. Right Ventricle: The right ventricular size is normal. No increase in right ventricular wall thickness. Right ventricular systolic function is normal. There is normal pulmonary artery systolic pressure. The tricuspid regurgitant velocity is 1.91 m/s, and  with an assumed right atrial pressure of 3 mmHg, the estimated right ventricular systolic pressure is 17.6 mmHg. Left Atrium: Left atrial size was normal in size. Right Atrium: Right atrial size was normal in size. Pericardium: There is no evidence of pericardial effusion. Mitral Valve: The mitral valve is normal in structure. Trivial mitral valve regurgitation. Tricuspid Valve: The tricuspid valve is normal in structure. Tricuspid valve regurgitation is not demonstrated. Aortic Valve: The aortic valve is tricuspid. Aortic valve regurgitation is mild to moderate. Aortic regurgitation PHT measures 670 msec. No aortic stenosis is present. Pulmonic Valve: The pulmonic valve was normal in structure. Pulmonic valve regurgitation is not visualized. Aorta: The aortic root and ascending aorta are structurally normal, with no evidence of dilitation. Venous: The inferior vena cava is normal in size with greater than 50% respiratory variability, suggesting right atrial pressure of 3 mmHg. IAS/Shunts: No atrial level shunt detected by color flow Doppler.  LEFT VENTRICLE PLAX 2D LVIDd:         5.40 cm         Diastology LVIDs:         4.00 cm         LV e' medial:    6.85 cm/s LV PW:         1.20 cm          LV E/e' medial:  10.0 LV IVS:        1.20 cm         LV e' lateral:   13.80 cm/s LVOT diam:     2.00 cm         LV E/e' lateral: 5.0 LV SV:         74 LV SV Index:   37 LVOT Area:     3.14 cm        3D Volume EF                                LV 3D EF:    Left                                             ventricul                                             ar  ejection                                             fraction                                             by 3D                                             volume is                                             53 %.                                 3D Volume EF:                                3D EF:        53 %                                LV EDV:       154 ml                                LV ESV:       72 ml                                LV SV:        81 ml RIGHT VENTRICLE RV S prime:     13.50 cm/s TAPSE (M-mode): 2.7 cm LEFT ATRIUM             Index        RIGHT ATRIUM           Index LA diam:        3.30 cm 1.65 cm/m   RA Area:     10.60 cm LA Vol (A2C):   29.1 ml 14.56 ml/m  RA Volume:   21.20 ml  10.61 ml/m LA Vol (A4C):   29.3 ml 14.66 ml/m LA Biplane Vol: 31.7 ml 15.86 ml/m  AORTIC VALVE LVOT Vmax:   117.00 cm/s LVOT Vmean:  71.900 cm/s LVOT VTI:    0.237 m AI PHT:      670 msec  AORTA Ao Root diam: 3.40 cm Ao Asc diam:  3.10 cm MITRAL VALVE               TRICUSPID VALVE MV Area (PHT): 3.12 cm    TR Peak grad:   14.6 mmHg MV Decel Time: 243 msec    TR Vmax:        191.00 cm/s MV E velocity: 68.70 cm/s MV A velocity: 55.00 cm/s  SHUNTS  MV E/A ratio:  1.25        Systemic VTI:  0.24 m                            Systemic Diam: 2.00 cm Thurmon Fair MD Electronically signed by Thurmon Fair MD Signature Date/Time: 10/21/2021/3:41:33 PM    Final    DG Chest 2 View  Result Date: 10/20/2021 CLINICAL DATA:  CP EXAM: CHEST - 2 VIEW COMPARISON:  None Available. FINDINGS: The heart size and mediastinal  contours are within normal limits. Both lungs are clear. The visualized skeletal structures are unremarkable. IMPRESSION: No active cardiopulmonary disease. Electronically Signed   By: Marjo Bicker M.D.   On: 10/20/2021 11:12    Disposition   Pt is being discharged home today in good condition.  Follow-up Plans & Appointments     Follow-up Information     Sharlene Dory, PA-C Follow up on 10/31/2021.   Specialty: Cardiothoracic Surgery Why: Appointment at 10:15 AM with cardiology PA Contact information: [address] [place] [address] [phone]         Wendall Stade, MD Follow up on 01/26/2022.   Specialty: Cardiology Why: Appointment at 8 aM Contact information: [address] [place] [address] [phone]                Discharge Instructions     Diet - low sodium heart healthy   Complete by: As directed    Discharge instructions   Complete by: As directed    Radial Site Care Refer to this sheet in the next few weeks. These instructions provide you with information on caring for yourself after your procedure. Your caregiver may also give you more specific instructions. Your treatment has been planned according to current medical practices, but problems sometimes occur. Call your caregiver if you have any problems or questions after your procedure. HOME CARE INSTRUCTIONS You may shower the day after the procedure. Remove the bandage (dressing) and gently wash the site with plain soap and water. Gently pat the site dry.  Do not apply powder or lotion to the site.  Do not submerge the affected site in water for 3 to 5 days.  Inspect the site at least twice daily.  Do not flex or bend the affected arm for 24 hours.  No lifting over 5 pounds (2.3 kg) for 5 days after your procedure.  Do not drive home if you are discharged the same day of the procedure. Have someone else drive you.  You may drive 24 hours after the procedure  unless otherwise instructed by your caregiver.  What to expect: Any bruising will usually fade within 1 to 2 weeks.  Blood that collects in the tissue (hematoma) may be painful to the touch. It should usually decrease in size and tenderness within 1 to 2 weeks.  SEEK IMMEDIATE MEDICAL CARE IF: You have unusual pain at the ulnar site.  You have redness, warmth, swelling, or pain at the radial site.  You have drainage (other than a small amount of blood on the dressing).  You have chills.  You have a fever or persistent symptoms for more than 72 hours.  You have a fever and your symptoms suddenly get worse.  Your arm becomes pale, cool, tingly, or numb.  You have heavy bleeding from the site. Hold pressure on the site.   Increase activity slowly  Complete by: As directed        Discharge Medications   Allergies as of 10/22/2021       Reactions   Shellfish Allergy Anaphylaxis, Swelling        Medication List     TAKE these medications    aspirin EC 81 MG tablet Take 1 tablet (81 mg total) by mouth daily. Swallow whole. Start taking on: October 23, 2021   atorvastatin 20 MG tablet Commonly known as: LIPITOR Take 1 tablet (20 mg total) by mouth daily. Start taking on: October 23, 2021   escitalopram 10 MG tablet Commonly known as: LEXAPRO Take 10 mg by mouth daily.   metoprolol tartrate 25 MG tablet Commonly known as: LOPRESSOR Take 0.5 tablets (12.5 mg total) by mouth 2 (two) times daily.   rizatriptan 10 MG tablet Commonly known as: Maxalt Take 1 tablet (10 mg total) by mouth as needed for migraine. May repeat in 2 hours if needed What changed: when to take this           Outstanding Labs/Studies   LFTs and lipid panel in 6-8 weeks   Duration of Discharge Encounter   Greater than 30 minutes including physician time.  Signed, Jonita AlbeeKathleen R. Mayre Bury, PA-C 10/22/2021, 10:21 AM

## 2021-10-22 NOTE — Progress Notes (Signed)
Explained discharge instructions to patient. Reviewed follow up appointments and next medication administration times. Patient verbalized having full understanding of the instructions given. PIV was removed and telemetry box was removed. CCMD was notified. TOC will be picked up on transport to the DC lounge.

## 2021-10-22 NOTE — Progress Notes (Signed)
Primary Cardiologist:  Shari Prows  Subjective:  No chest pain having cardiac MRI this am   Objective:  Vitals:   10/21/21 1400 10/21/21 1953 10/21/21 2053 10/22/21 0613  BP: 127/74 117/71 111/79 115/75  Pulse:  60 64   Resp:  18  17  Temp: 98.3 F (36.8 C) 98.5 F (36.9 C)  98.8 F (37.1 C)  TempSrc: Oral Oral  Oral  SpO2: 98% 97%  97%  Weight:      Height:        Intake/Output from previous day: No intake or output data in the 24 hours ending 10/22/21 0826  Physical Exam: Affect appropriate Healthy:  appears stated age HEENT: normal Neck supple with no adenopathy JVP normal no bruits no thyromegaly Lungs clear with no wheezing and good diaphragmatic motion Heart:  S1/S2 no murmur, no rub, gallop or click PMI normal Abdomen: benighn, BS positve, no tenderness, no AAA no bruit.  No HSM or HJR Distal pulses intact with no bruits No edema Neuro non-focal Skin warm and dry No muscular weakness Right Ulnar cath site A no hematoma good pulse    Lab Results: Basic Metabolic Panel: Recent Labs    10/20/21 1100 10/20/21 2126 10/21/21 0500  NA 137  --  138  K 4.2  --  4.2  CL 104  --  105  CO2 25  --  26  GLUCOSE 108*  --  106*  BUN 10  --  13  CREATININE 0.86 1.01 1.07  CALCIUM 9.4  --  9.1   Liver Function Tests: No results for input(s): "AST", "ALT", "ALKPHOS", "BILITOT", "PROT", "ALBUMIN" in the last 72 hours. No results for input(s): "LIPASE", "AMYLASE" in the last 72 hours. CBC: Recent Labs    10/21/21 0500 10/22/21 0318  WBC 6.0 5.9  HGB 15.1 14.9  HCT 43.2 42.5  MCV 84.5 84.2  PLT 278 305    Hemoglobin A1C: Recent Labs    10/20/21 2126  HGBA1C 5.6   Fasting Lipid Panel: Recent Labs    10/21/21 0500  CHOL 189  HDL 32*  LDLCALC 120*  TRIG 183*  CHOLHDL 5.9   Thyroid Function Tests: Recent Labs    10/20/21 2126  TSH 0.974     Imaging: CARDIAC CATHETERIZATION  Addendum Date: 10/21/2021     The left ventricular systolic  function is normal.   LV end diastolic pressure is normal.   The left ventricular ejection fraction is 55-65% by visual estimate.   There is no aortic valve stenosis.   No angiographically apparent coronary artery disease. Continue preventive therapy.  Plan for cardiac MRI to evaluate for myocarditis. Results discussed with the patient's wife.  [phone].  Result Date: 10/21/2021   The left ventricular systolic function is normal.   LV end diastolic pressure is normal.   The left ventricular ejection fraction is 55-65% by visual estimate.   There is no aortic valve stenosis.   No angiographically apparent coronary artery disease. Continue preventive therapy.  Plan for cardiac MRI to evaluate for myocarditis. Results discussed with the patient's wife.  [phone].   ECHOCARDIOGRAM COMPLETE  Result Date: 10/21/2021    ECHOCARDIOGRAM REPORT   Patient Name:   Seth Vance Date of Exam: 10/21/2021 Medical Rec #:  [id number]     Height:       65.0 in Accession #:    [id number]    Weight:       205.0 lb Date of Birth:  November 12, 1985     BSA:          1.999 m Patient Age:    35 years      BP:           127/74 mmHg Patient Gender: M             HR:           68 bpm. Exam Location:  Inpatient Procedure: 2D Echo, 3D Echo, Cardiac Doppler and Color Doppler Indications:    Chest Pain R07.9  History:        Patient has no prior history of Echocardiogram examinations.  Sonographer:    Leta Jungling RDCS Referring Phys: [id number] Manson Passey IMPRESSIONS  1. Left ventricular ejection fraction, by estimation, is 55 to 60%. Left ventricular ejection fraction by 3D volume is 53 %. The left ventricle has normal function. The left ventricle has no regional wall motion abnormalities. There is mild concentric left ventricular hypertrophy. Left ventricular diastolic parameters were normal.  2. Right ventricular systolic function is normal. The right ventricular size is normal. There is normal pulmonary artery systolic pressure.   3. The mitral valve is normal in structure. Trivial mitral valve regurgitation.  4. The aortic valve is tricuspid. Aortic valve regurgitation is mild to moderate. No aortic stenosis is present.  5. The inferior vena cava is normal in size with greater than 50% respiratory variability, suggesting right atrial pressure of 3 mmHg. FINDINGS  Left Ventricle: Left ventricular ejection fraction, by estimation, is 55 to 60%. Left ventricular ejection fraction by 3D volume is 53 %. The left ventricle has normal function. The left ventricle has no regional wall motion abnormalities. The left ventricular internal cavity size was normal in size. There is mild concentric left ventricular hypertrophy. Left ventricular diastolic parameters were normal. Normal left ventricular filling pressure. Right Ventricle: The right ventricular size is normal. No increase in right ventricular wall thickness. Right ventricular systolic function is normal. There is normal pulmonary artery systolic pressure. The tricuspid regurgitant velocity is 1.91 m/s, and  with an assumed right atrial pressure of 3 mmHg, the estimated right ventricular systolic pressure is 17.6 mmHg. Left Atrium: Left atrial size was normal in size. Right Atrium: Right atrial size was normal in size. Pericardium: There is no evidence of pericardial effusion. Mitral Valve: The mitral valve is normal in structure. Trivial mitral valve regurgitation. Tricuspid Valve: The tricuspid valve is normal in structure. Tricuspid valve regurgitation is not demonstrated. Aortic Valve: The aortic valve is tricuspid. Aortic valve regurgitation is mild to moderate. Aortic regurgitation PHT measures 670 msec. No aortic stenosis is present. Pulmonic Valve: The pulmonic valve was normal in structure. Pulmonic valve regurgitation is not visualized. Aorta: The aortic root and ascending aorta are structurally normal, with no evidence of dilitation. Venous: The inferior vena cava is normal in size  with greater than 50% respiratory variability, suggesting right atrial pressure of 3 mmHg. IAS/Shunts: No atrial level shunt detected by color flow Doppler.  LEFT VENTRICLE PLAX 2D LVIDd:         5.40 cm         Diastology LVIDs:         4.00 cm         LV e' medial:    6.85 cm/s LV PW:         1.20 cm         LV E/e' medial:  10.0 LV IVS:        1.20  cm         LV e' lateral:   13.80 cm/s LVOT diam:     2.00 cm         LV E/e' lateral: 5.0 LV SV:         74 LV SV Index:   37 LVOT Area:     3.14 cm        3D Volume EF                                LV 3D EF:    Left                                             ventricul                                             ar                                             ejection                                             fraction                                             by 3D                                             volume is                                             53 %.                                 3D Volume EF:                                3D EF:        53 %                                LV EDV:       154 ml                                LV ESV:       72 ml  LV SV:        81 ml RIGHT VENTRICLE RV S prime:     13.50 cm/s TAPSE (M-mode): 2.7 cm LEFT ATRIUM             Index        RIGHT ATRIUM           Index LA diam:        3.30 cm 1.65 cm/m   RA Area:     10.60 cm LA Vol (A2C):   29.1 ml 14.56 ml/m  RA Volume:   21.20 ml  10.61 ml/m LA Vol (A4C):   29.3 ml 14.66 ml/m LA Biplane Vol: 31.7 ml 15.86 ml/m  AORTIC VALVE LVOT Vmax:   117.00 cm/s LVOT Vmean:  71.900 cm/s LVOT VTI:    0.237 m AI PHT:      670 msec  AORTA Ao Root diam: 3.40 cm Ao Asc diam:  3.10 cm MITRAL VALVE               TRICUSPID VALVE MV Area (PHT): 3.12 cm    TR Peak grad:   14.6 mmHg MV Decel Time: 243 msec    TR Vmax:        191.00 cm/s MV E velocity: 68.70 cm/s MV A velocity: 55.00 cm/s  SHUNTS MV E/A ratio:  1.25        Systemic VTI:  0.24 m                             Systemic Diam: 2.00 cm Rachelle Hora Croitoru MD Electronically signed by Thurmon Fair MD Signature Date/Time: 10/21/2021/3:41:33 PM    Final    DG Chest 2 View  Result Date: 10/20/2021 CLINICAL DATA:  CP EXAM: CHEST - 2 VIEW COMPARISON:  None Available. FINDINGS: The heart size and mediastinal contours are within normal limits. Both lungs are clear. The visualized skeletal structures are unremarkable. IMPRESSION: No active cardiopulmonary disease. Electronically Signed   By: Marjo Bicker M.D.   On: 10/20/2021 11:12    Cardiac Studies:  ECG: SR rate 58 low voltage no acute ST changes    Telemetry:  NSR 10/22/2021   Echo: pending   Medications:    aspirin EC  81 mg Oral Daily   atorvastatin  20 mg Oral Daily   escitalopram  10 mg Oral Daily   metoprolol tartrate  12.5 mg Oral BID   sodium chloride flush  3 mL Intravenous Q12H      sodium chloride      Assessment/Plan:   SEMI:  ? Chest pain with positive family history Troponin rising 92-> 523 no acute ECG changes continue ASA/statin, beta blocker Heparin d/c Cath with no significant epicardial CAD Cardiac MRI this am If evidence of pericarditis/myocarditis would Rx with colchicine 0.6 mg bid and Motrin 800 mg tid. Ok to d/c home latter today after cardiac MRI read Will arrange outpatient f/u with me Telemetry with no NSVT or arrhythmias TTE 10/21/21 no effusion normal EF 55-60% no RWMA;s   Charlton Haws 10/22/2021, 8:26 AM

## 2021-10-22 NOTE — Telephone Encounter (Signed)
  Patient is scheduled for a [facility] hospital f/u with Jari Favre on 10/31/21 at 10:15 am per Dr Eden Emms

## 2021-10-22 NOTE — Progress Notes (Signed)
MRI with normal EF no effusion and no evidence Of myocarditis on gadolinium images Ok to d/c home Will f/u with me in 3 months for his AR  Charlton Haws MD [facility]

## 2021-10-23 NOTE — Telephone Encounter (Signed)
**Note De-Identified Bobbi Kozakiewicz Obfuscation** Patient contacted regarding discharge from [facility] on 10/22/2021.  Patient understands to follow up with provider Jari Favre, PA-c on 10/31/2021 at 10:15 at [address], [address] in [place], [place] [address]. Patient understands discharge instructions? Yes Patient understands medications and regiment? Yes Patient understands to bring all medications to this visit? Yes  Ask patient:  Are you enrolled in My Chart: Yes   The pt reports that he "feels great" and is without any c/o CP/discomfort, nausea, vomiting, diaphoresis, dizziness, or lightheadedness.  He verified that he has [facility]'s phone number and is aware to call us if he has any questions or concerns. He thanked me for my call.

## 2021-10-25 ENCOUNTER — Emergency Department (HOSPITAL_COMMUNITY)
Admission: EM | Admit: 2021-10-25 | Discharge: 2021-10-25 | Discharge status: Against Medical Advice | Payer: BC Managed Care – PPO | Attending: Emergency Medicine | Admitting: Emergency Medicine

## 2021-10-25 ENCOUNTER — Encounter: Payer: Self-pay | Admitting: Cardiovascular Disease

## 2021-10-25 ENCOUNTER — Encounter (HOSPITAL_COMMUNITY): Payer: Self-pay | Admitting: Emergency Medicine

## 2021-10-25 DIAGNOSIS — M25521 Pain in right elbow: Secondary | ICD-10-CM | POA: Diagnosis present

## 2021-10-25 DIAGNOSIS — Z5321 Procedure and treatment not carried out due to patient leaving prior to being seen by health care provider: Secondary | ICD-10-CM | POA: Insufficient documentation

## 2021-10-25 NOTE — ED Triage Notes (Addendum)
Pt had full cardiac test including cath which was clean. Has bruise to R arm elbow area in same arm they did cath in. He states growing every day. Denies pain. It is not swollen or red or hot to touch

## 2021-10-25 NOTE — ED Notes (Signed)
PT opting to leave at this time 

## 2021-10-27 NOTE — Telephone Encounter (Signed)
I saw the note to Dr. Eden Emms and the nursing response. Good questions. I will continue to follow-up! Bruising is normal appearing and I don't have a concern for hematoma based on the pictures sent. That being said, bruising should be resolving and not spreading.   Thanks!   Sharlene Dory, PA-C   Patient sent 2 my chart messages.  This is from the other message.

## 2021-10-30 NOTE — Progress Notes (Signed)
Office Visit    Patient Name: Seth Vance Date of Encounter: 10/31/2021  PCP:  Rosemary Holms   [facility]  Cardiologist:  Charlton Haws, MD  Advanced Practice Provider:  No care team member to display Electrophysiologist:  None   Chief Complaint    Seth Vance is a 36 y.o. male without significant cardiac history presents today for hospital follow-up.  He presented to the ED on 7/10 for evaluation of chest pain.  Patient reported that on the morning of admission he developed midsternal burning sensation that radiated to his upper jaw and across his chest.  He denied any diaphoresis, nausea, or diarrhea.  His symptoms persisted so he called EMS.  He was given aspirin 324 mg of brought to the ED.  Initial troponin was negative.  However second troponin came back at 92 and he was advised to come back to the ER.  His symptoms had spontaneously resolved around 3 PM and there was no recurrence.  The patient originally thought his chest discomfort was due to anxiety.  His troponin eventually peaked at 523.  He underwent cardiac catheterization which showed left ventricular ejection fraction 55 to 65%, no aortic valve stenosis, no coronary artery disease.  Plan was for cardiac MRI to evaluate for myocarditis.  Cardiac MRI was performed which showed LVEF 54%, no delayed uptake of gadolinium, no evidence of myocarditis  Today, he presents for follow-up.  He sent a few messages to me prior to this appointment for a bruise on his forearm but not at the site of the catheterization.  This bruise appeared to be resolving however, yesterday he sent images that were concerning for the bruise getting larger.  Today he does have a larger bruise and some edema.  He has some pain over his right forearm.  Discussed with Dr. Eldridge Dace and agree that we can order an ultrasound to rule out pseudoaneurysm.  Likely no CAD was found on his most recent cardiac catheterization.   Reports  no shortness of breath nor dyspnea on exertion. Reports no chest pain, pressure, or tightness. No edema, orthopnea, PND. Reports no palpitations.    Past Medical History    Past Medical History:  Diagnosis Date   Asthma    as a child   Headache    History of kidney stones    Past Surgical History:  Procedure Laterality Date   EXTRACORPOREAL SHOCK WAVE LITHOTRIPSY Left 10/27/2018   Procedure: EXTRACORPOREAL SHOCK WAVE LITHOTRIPSY (ESWL);  Surgeon: Crist Fat, MD;  Location: [facility]S;  Service: Urology;  Laterality: Left;   LEFT HEART CATH AND CORONARY ANGIOGRAPHY N/A 10/21/2021   Procedure: LEFT HEART CATH AND CORONARY ANGIOGRAPHY;  Surgeon: Corky Crafts, MD;  Location: [facility] INVASIVE CV LAB;  Service: Cardiovascular;  Laterality: N/A;   NO PAST SURGERIES      Allergies  Allergies  Allergen Reactions   Shellfish Allergy Anaphylaxis and Swelling    EKGs/Labs/Other Studies Reviewed:   The following studies were reviewed today:  LHC 10/21/21    The left ventricular systolic function is normal.   LV end diastolic pressure is normal.   The left ventricular ejection fraction is 55-65% by visual estimate.   There is no aortic valve stenosis.   No angiographically apparent coronary artery disease.   Continue preventive therapy.  Plan for cardiac MRI to evaluate for myocarditis.    Echocardiogram 10/21/21 1. Left ventricular ejection fraction, by estimation, is 55 to 60%. Left  ventricular ejection fraction by 3D volume is 53 %. The left ventricle has  normal function. The left ventricle has no regional wall motion  abnormalities. There is mild concentric  left ventricular hypertrophy. Left ventricular diastolic parameters were  normal.   2. Right ventricular systolic function is normal. The right ventricular  size is normal. There is normal pulmonary artery systolic pressure.   3. The mitral valve is normal in structure. Trivial mitral valve  regurgitation.   4. The  aortic valve is tricuspid. Aortic valve regurgitation is mild to  moderate. No aortic stenosis is present.   5. The inferior vena cava is normal in size with greater than 50%  respiratory variability, suggesting right atrial pressure of 3 mmHg.    Cardiac MRI 10/22/21  1. Normal LV size and function EF 54%   2. No delayed gadolinium uptake and normal T2 44 msec No evidence of myocarditis   3.  Normal RV size and function   4.  No significant pericardial effusion   5.  Mild AR   6.  Normal ascending thoracic aorta 2.8 cm    EKG:  EKG is not ordered today.    Recent Labs: 10/20/2021: TSH 0.974 10/21/2021: BUN 13; Creatinine, Ser 1.07; Potassium 4.2; Sodium 138 10/22/2021: Hemoglobin 14.9; Platelets 305  Recent Lipid Panel    Component Value Date/Time   CHOL 189 10/21/2021 0500   TRIG 183 (H) 10/21/2021 0500   HDL 32 (L) 10/21/2021 0500   CHOLHDL 5.9 10/21/2021 0500   VLDL 37 10/21/2021 0500   LDLCALC 120 (H) 10/21/2021 0500    Home Medications   Current Meds  Medication Sig   aspirin EC 81 MG tablet Take 1 tablet (81 mg total) by mouth daily. Swallow whole.   atorvastatin (LIPITOR) 20 MG tablet Take 1 tablet (20 mg total) by mouth daily.   escitalopram (LEXAPRO) 10 MG tablet Take 10 mg by mouth daily.   metoprolol tartrate (LOPRESSOR) 25 MG tablet Take 1/2 tablet (12.5 mg total) by mouth 2 (two) times daily.   rizatriptan (MAXALT) 10 MG tablet Take 1 tablet (10 mg total) by mouth as needed for migraine. May repeat in 2 hours if needed     Review of Systems      All other systems reviewed and are otherwise negative except as noted above.  Physical Exam    VS:  BP 110/70 (BP Location: Left Arm, Patient Position: Sitting, Cuff Size: Normal)   Pulse 65   Ht 5\' 5"  (1.651 m)   Wt 203 lb (92.1 kg)   SpO2 97%   BMI 33.78 kg/m  , BMI Body mass index is 33.78 kg/m.  Wt Readings from Last 3 Encounters:  10/31/21 203 lb (92.1 kg)  10/20/21 205 lb (93 kg)  10/20/21 205  lb (93 kg)     GEN: Well nourished, well developed, in no acute distress. HEENT: normal. Neck: Supple, no JVD, carotid bruits, or masses. Cardiac: RRR, no murmurs, rubs, or gallops. No clubbing, cyanosis, edema.  Radials/PT 2+ and equal bilaterally.  Respiratory:  Respirations regular and unlabored, clear to auscultation bilaterally. GI: Soft, nontender, nondistended. MS: No deformity or atrophy. Skin: Warm and dry, no rash. Neuro:  Strength and sensation are intact. Psych: Normal affect.  Assessment & Plan    Ecchymosis following cardiac catheterization -He has some minor swelling and extensive ecchymosis on his right forearm.  He does have some tenderness as well. -We will order a an ultrasound of the upper extremity  to rule out pseudoaneurysm  Mild AR -repeat Echo in 3 months (Oct before his appointment with [facility])  3.  Chest pain with elevated troponin -No chest pain or SOB -No CAD found on cardiac cath  4.  HLD -Started Lipitor on recent admission -We will need to repeat lipid panel in 6 to 8 weeks and LFTs  Disposition: Follow up 3 months with Charlton Haws, MD or APP.  Signed, Sharlene Dory, PA-C 10/31/2021, 10:43 AM [facility]

## 2021-10-31 ENCOUNTER — Ambulatory Visit (HOSPITAL_COMMUNITY)
Admission: RE | Admit: 2021-10-31 | Discharge: 2021-10-31 | Disposition: A | Discharge status: Home / Self Care | Payer: BC Managed Care – PPO | Source: Ambulatory Visit | Attending: Physician Assistant | Admitting: Physician Assistant

## 2021-10-31 ENCOUNTER — Ambulatory Visit (INDEPENDENT_AMBULATORY_CARE_PROVIDER_SITE_OTHER): Payer: BC Managed Care – PPO | Admitting: Physician Assistant

## 2021-10-31 ENCOUNTER — Encounter: Payer: Self-pay | Admitting: Physician Assistant

## 2021-10-31 VITALS — BP 110/70 | HR 65 | Ht 65.0 in | Wt 203.0 lb

## 2021-10-31 DIAGNOSIS — R079 Chest pain, unspecified: Secondary | ICD-10-CM

## 2021-10-31 DIAGNOSIS — E785 Hyperlipidemia, unspecified: Secondary | ICD-10-CM

## 2021-10-31 DIAGNOSIS — M79601 Pain in right arm: Secondary | ICD-10-CM | POA: Diagnosis not present

## 2021-10-31 DIAGNOSIS — R58 Hemorrhage, not elsewhere classified: Secondary | ICD-10-CM | POA: Diagnosis not present

## 2021-10-31 DIAGNOSIS — I351 Nonrheumatic aortic (valve) insufficiency: Secondary | ICD-10-CM | POA: Diagnosis not present

## 2021-10-31 NOTE — Addendum Note (Signed)
Addended by: Scherrie Bateman E on: 10/31/2021 10:56 AM   Modules accepted: Orders

## 2021-10-31 NOTE — Patient Instructions (Signed)
Medication Instructions:  NO CHANGES *If you need a refill on your cardiac medications before your next appointment, please call your pharmacy*   Lab Work: FASTING LIPID IN 6-8 WEEKS If you have labs (blood work) drawn today and your tests are completely normal, you will receive your results only by: MyChart Message (if you have MyChart) OR A paper copy in the mail If you have any lab test that is abnormal or we need to change your treatment, we will call you to review the results.   Testing/Procedures: Your physician has requested that you have a lower or upper extremity arterial duplex. This test is an ultrasound of the arteries in the legs or arms. It looks at arterial blood flow in the legs and arms. Allow one hour for Lower and Upper Arterial scans. There are no restrictions or special instructions RIGHT UPPER EXTREMITY R/O PSEUDO    Follow-Up: At [facility], you and your health needs are our priority.  As part of our continuing mission to provide you with exceptional heart care, we have created designated Provider Care Teams.  These Care Teams include your primary Cardiologist (physician) and Advanced Practice Providers (APPs -  Physician Assistants and Nurse Practitioners) who all work together to provide you with the care you need, when you need it.  We recommend signing up for the patient portal called "MyChart".  Sign up information is provided on this After Visit Summary.  MyChart is used to connect with patients for Virtual Visits (Telemedicine).  Patients are able to view lab/test results, encounter notes, upcoming appointments, etc.  Non-urgent messages can be sent to your provider as well.   To learn more about what you can do with MyChart, go to [email or web address].    Your next appointment:   KEEP AS PLANNED  The format for your next appointment:     Provider:       Other Instructions ECHO  DUE IN OCTOBER NEEDS PRIOR TO APPT ALREADY MADE WITH DR Eden Emms    Important Information About Sugar

## 2021-12-04 ENCOUNTER — Ambulatory Visit: Payer: BC Managed Care – PPO | Admitting: Cardiovascular Disease

## 2021-12-12 ENCOUNTER — Ambulatory Visit: Payer: BC Managed Care – PPO | Attending: Physician Assistant

## 2021-12-12 DIAGNOSIS — E785 Hyperlipidemia, unspecified: Secondary | ICD-10-CM

## 2022-01-16 ENCOUNTER — Ambulatory Visit (HOSPITAL_COMMUNITY): Payer: BC Managed Care – PPO | Attending: Cardiology

## 2022-01-16 DIAGNOSIS — I351 Nonrheumatic aortic (valve) insufficiency: Secondary | ICD-10-CM | POA: Insufficient documentation

## 2022-01-16 DIAGNOSIS — R079 Chest pain, unspecified: Secondary | ICD-10-CM | POA: Diagnosis not present

## 2022-01-20 LAB — ECHOCARDIOGRAM COMPLETE
Area-P 1/2: 3.48 cm2
P 1/2 time: 471 msec
S' Lateral: 3.5 cm

## 2022-01-21 NOTE — Progress Notes (Signed)
Office Visit    Patient Name: Seth Vance Date of Encounter: 01/26/2022  PCP:  Moreira, Niall A, Burke  Cardiologist:  Jenkins Rouge, MD  Advanced Practice Provider:  No care team member to display Electrophysiologist:  None   Chief Complaint    [name] is a 36 y.o. male without significant cardiac history presents today for hospital follow-up.  He presented to the ED on 7/10 for evaluation of chest pain. Initial troponin was negative.  However second troponin came back at 92.  His troponin eventually peaked at 523.  He underwent cardiac catheterization which showed left ventricular ejection fraction 55 to 65%, no aortic valve stenosis, no coronary artery disease.  Plan was for cardiac MRI to evaluate for myocarditis.  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 [employer]   Past Medical History    Past Medical History:  Diagnosis Date   Asthma    as a child   Headache    History of kidney stones    Past Surgical History:  Procedure Laterality Date   EXTRACORPOREAL SHOCK WAVE LITHOTRIPSY Left 10/27/2018   Procedure: EXTRACORPOREAL SHOCK WAVE LITHOTRIPSY (ESWL);  Surgeon: Ardis Hughs, MD;  Location: [facility];  Service: Urology;  Laterality: Left;   LEFT HEART CATH AND CORONARY ANGIOGRAPHY N/A 10/21/2021   Procedure: LEFT HEART CATH AND CORONARY ANGIOGRAPHY;  Surgeon: Jettie Booze, MD;  Location: [facility];  Service: Cardiovascular;  Laterality: N/A;   NO PAST SURGERIES      Allergies  Allergies  Allergen Reactions   Shellfish Allergy Anaphylaxis and Swelling    EKGs/Labs/Other Studies Reviewed:   The following studies were reviewed  today:  LHC 10/21/21    The left ventricular systolic function is normal.   LV end diastolic pressure is normal.   The left ventricular ejection fraction is 55-65% by visual estimate.   There is no aortic valve stenosis.   No angiographically apparent coronary artery disease.   Continue preventive therapy.  Plan for cardiac MRI to evaluate for myocarditis.    Echocardiogram 10/21/21 1. Left ventricular ejection fraction, by estimation, is 55 to 60%. Left  ventricular ejection fraction by 3D volume is 53 %. The left ventricle has  normal function. The left ventricle has no regional wall motion  abnormalities. There is mild concentric  left ventricular hypertrophy. Left ventricular diastolic parameters were  normal.   2. Right ventricular systolic function is normal. The right ventricular  size is normal. There is normal pulmonary artery systolic pressure.   3. The mitral valve is normal in structure. Trivial mitral valve  regurgitation.   4. The aortic valve is tricuspid. Aortic valve regurgitation is mild to  moderate. No aortic stenosis is present.   5. The inferior vena cava is normal in size with greater than 50%  respiratory variability, suggesting right atrial pressure of 3 mmHg.    Cardiac MRI 10/22/21  1. Normal LV size and function EF 54%   2. No delayed gadolinium uptake and normal T2 44 msec No evidence of myocarditis   3.  Normal RV size and function   4.  No significant pericardial effusion   5.  Mild AR  6.  Normal ascending thoracic aorta 2.8 cm    EKG:  EKG is not ordered today.    Recent Labs: 10/20/2021: TSH 0.974 10/21/2021: BUN 13; Creatinine, Ser 1.07; Potassium 4.2; Sodium 138 10/22/2021: Hemoglobin 14.9; Platelets 305  Recent Lipid Panel    Component Value Date/Time   CHOL 189 10/21/2021 0500   TRIG 183 (H) 10/21/2021 0500   HDL 32 (L) 10/21/2021 0500   CHOLHDL 5.9 10/21/2021 0500   VLDL 37 10/21/2021 0500   LDLCALC 120 (H) 10/21/2021 0500     Home Medications   Current Meds  Medication Sig   aspirin EC 81 MG tablet Take 1 tablet (81 mg total) by mouth daily. Swallow whole.   escitalopram (LEXAPRO) 10 MG tablet Take 10 mg by mouth daily.   rizatriptan (MAXALT) 10 MG tablet Take 1 tablet (10 mg total) by mouth as needed for migraine. May repeat in 2 hours if needed     Review of Systems      All other systems reviewed and are otherwise negative except as noted above.  Physical Exam    VS:  BP 110/60   Pulse 60   Ht 5\' 5"  (1.651 m)   Wt 211 lb (95.7 kg)   SpO2 97%   BMI 35.11 kg/m  , BMI Body mass index is 35.11 kg/m.  Wt Readings from Last 3 Encounters:  01/26/22 211 lb (95.7 kg)  10/31/21 203 lb (92.1 kg)  10/20/21 205 lb (93 kg)     Affect appropriate Healthy:  appears stated age HEENT: normal Neck supple with no adenopathy JVP normal no bruits no thyromegaly Lungs clear with no wheezing and good diaphragmatic motion Heart:  S1/S2 no murmur, no rub, gallop or click PMI normal Abdomen: benighn, BS positve, no tenderness, no AAA no bruit.  No HSM or HJR Distal pulses intact with no bruits No edema Neuro non-focal Skin warm and dry No muscular weakness   Assessment & Plan    Ecchymosis following cardiac catheterization -12/21/21 negative for pseudo aneurysm  Mild AR -no murmur on exam observe   3.  Chest pain with elevated troponin -No chest pain or SOB -No CAD found on cardiac cath - MRI negative for myocarditis   4.  HLD -Started Lipitor on recent admission - labs with primary   Disposition: Follow up PRN  Signed, [place], MD 01/26/2022, 8:07 AM [facility]

## 2022-01-26 ENCOUNTER — Encounter: Payer: Self-pay | Admitting: Cardiovascular Disease

## 2022-01-26 ENCOUNTER — Ambulatory Visit: Payer: BC Managed Care – PPO | Attending: Cardiovascular Disease | Admitting: Cardiovascular Disease

## 2022-01-26 VITALS — BP 110/60 | HR 60 | Ht 65.0 in | Wt 211.0 lb

## 2022-01-26 DIAGNOSIS — R079 Chest pain, unspecified: Secondary | ICD-10-CM | POA: Diagnosis not present

## 2022-01-26 DIAGNOSIS — I351 Nonrheumatic aortic (valve) insufficiency: Secondary | ICD-10-CM

## 2022-01-26 DIAGNOSIS — E785 Hyperlipidemia, unspecified: Secondary | ICD-10-CM | POA: Diagnosis not present

## 2022-01-26 NOTE — Patient Instructions (Signed)
Medication Instructions:  Your physician recommends that you continue on your current medications as directed. Please refer to the Current Medication list given to you today.  *If you need a refill on your cardiac medications before your next appointment, please call your pharmacy*  Lab Work: If you have labs (blood work) drawn today and your tests are completely normal, you will receive your results only by: MyChart Message (if you have MyChart) OR A paper copy in the mail If you have any lab test that is abnormal or we need to change your treatment, we will call you to review the results.  Testing/Procedures: None ordered today.  Follow-Up: At [facility], you and your health needs are our priority.  As part of our continuing mission to provide you with exceptional heart care, we have created designated Provider Care Teams.  These Care Teams include your primary Cardiologist (physician) and Advanced Practice Providers (APPs -  Physician Assistants and Nurse Practitioners) who all work together to provide you with the care you need, when you need it.  We recommend signing up for the patient portal called "MyChart".  Sign up information is provided on this After Visit Summary.  MyChart is used to connect with patients for Virtual Visits (Telemedicine).  Patients are able to view lab/test results, encounter notes, upcoming appointments, etc.  Non-urgent messages can be sent to your provider as well.   To learn more about what you can do with MyChart, go to [email or web address].    Your next appointment:   As needed  The format for your next appointment:   In Person  Provider:   Peter Nishan, MD      Important Information About Sugar       

## 2022-01-28 NOTE — Progress Notes (Unsigned)
   CC:  headaches  Follow-up Visit  Last visit: 05/20/21  Brief HPI: 36 year old male with a history of anxiety, nephrolithiasis who follows in clinic for migraine with aura. Watson and CTA head/neck 04/23/21 were unremarkable.  At his last visit he was started on Maxalt for migraine rescue. Interval History: ***   Headache days per month: *** Migraine days per month*** Headache free days per month: ***  Current Headache Regimen: Preventative: *** Abortive: ***   Prior Therapies                                  Ibuprofen Tylenol Maxalt 10 mg PRN  Physical Exam:   Vital Signs: There were no vitals taken for this visit. GENERAL:  well appearing, in no acute distress, alert  SKIN:  Color, texture, turgor normal. No rashes or lesions HEAD:  Normocephalic/atraumatic. RESP: normal respiratory effort MSK:  No gross joint deformities.   NEUROLOGICAL: Mental Status: Alert, oriented to person, place and time, Follows commands, and Speech fluent and appropriate. Cranial Nerves: PERRL, face symmetric, no dysarthria, hearing grossly intact Motor: moves all extremities equally Gait: normal-based.  IMPRESSION: ***  PLAN: ***   Follow-up: ***  I spent a total of *** minutes on the date of the service. Headache education was done. Discussed lifestyle modification including increased oral hydration, decreased caffeine, exercise and stress management. Discussed treatment options including preventive and acute medications, natural supplements, and infusion therapy. Discussed medication overuse headache and to limit use of acute treatments to no more than 2 days/week or 10 days/month. Discussed medication side effects, adverse reactions and drug interactions. Written educational materials and patient instructions outlining all of the above were given.  Genia Harold, MD

## 2022-01-29 ENCOUNTER — Encounter: Payer: Self-pay | Admitting: Psychiatry

## 2022-01-29 ENCOUNTER — Ambulatory Visit (INDEPENDENT_AMBULATORY_CARE_PROVIDER_SITE_OTHER): Payer: BC Managed Care – PPO | Admitting: Psychiatry

## 2022-01-29 VITALS — BP 130/87 | HR 54 | Ht 65.0 in | Wt 213.4 lb

## 2022-01-29 DIAGNOSIS — G43101 Migraine with aura, not intractable, with status migrainosus: Secondary | ICD-10-CM | POA: Diagnosis not present

## 2022-01-29 MED ORDER — SUMATRIPTAN SUCCINATE 50 MG PO TABS: 50.0000 mg | ORAL_TABLET | ORAL | 6 refills | Status: AC | PRN

## 2022-01-29 NOTE — Patient Instructions (Addendum)
  Common food triggers:  Note that only 20% of headache patients are food sensitive. You will know if you are food sensitive if you get a headache consistently 20 minutes to 2 hours after eating a certain food. Only cut out a food if it causes headaches, otherwise you might remove foods you enjoy! What matters most for diet is to eat a well balanced healthy diet full of vegetables and low fat protein, and to not miss meals.  Chocolate, other sweets ALL cheeses except cottage and cream cheese Dairy products, yogurt, sour cream, ice cream Liver Meat extracts (Bovril, Marmite, meat tenderizers) Meats or fish which have undergone aging, fermenting, pickling or smoking. These include: Hotdogs,salami,Lox,sausage, mortadellas,smoked salmon, pepperoni, Pickled herring Pods of broad bean (English beans, Chinese pea pods, [place] (fava) beans, lima and navy beans Ripe avocado, ripe banana Yeast extracts or active yeast preparations such as Brewer's or Fleishman's (commercial bakes goods are permitted) Tomato based foods, pizza (lasagna, etc.)  MSG (monosodium glutamate) is disguised as many things; look for these common aliases: Monopotassium glutamate Autolysed yeast Hydrolysed protein Sodium caseinate "flavorings" "all natural preservatives" Nutrasweet   Resources: The Dizzy Lu Duffel Your Headache Diet, migrainestrong.com  https://www.aguirre.org/  Caffeine and Migraine For patients that have migraine, caffeine intake more than 3 days per week can lead to dependency and increased migraine frequency. I would recommend cutting back on your caffeine intake as best you can. The recommended amount of caffeine is 200-300 mg daily, although migraine patients may experience dependency at even lower doses. While you may notice an increase in headache temporarily, cutting back will be helpful for headaches in the long run. For more information on caffeine  and migraine, visit: https://americanmigrainefoundation.org/resource-library/caffeine-and-migraine/

## 2022-05-21 NOTE — Progress Notes (Deleted)
   CC:  headaches  Follow-up Visit  Last visit: 01/29/22  Brief HPI: 37 year old male with a history of anxiety, nephrolithiasis who follows in clinic for migraine with aura. [place] and CTA head/neck 04/23/21 were unremarkable.   At his last visit he was switched to Imitrex for rescue. Interval History: Headaches***Imitrex***   Headache days per month: *** Migraine days per month*** Headache free days per month: ***  Current Headache Regimen: Preventative: *** Abortive: ***   Prior Therapies                                  Ibuprofen Tylenol Maxalt 10 mg PRN - drowsiness  Physical Exam:   Vital Signs: There were no vitals taken for this visit. GENERAL:  well appearing, in no acute distress, alert  SKIN:  Color, texture, turgor normal. No rashes or lesions HEAD:  Normocephalic/atraumatic. RESP: normal respiratory effort MSK:  No gross joint deformities.   NEUROLOGICAL: Mental Status: Alert, oriented to person, place and time, Follows commands, and Speech fluent and appropriate. Cranial Nerves: PERRL, face symmetric, no dysarthria, hearing grossly intact Motor: moves all extremities equally Gait: normal-based.  IMPRESSION: ***  PLAN: ***   Follow-up: ***  I spent a total of *** minutes on the date of the service. Headache education was done. Discussed lifestyle modification including increased oral hydration, decreased caffeine, exercise and stress management. Discussed treatment options including preventive and acute medications, natural supplements, and infusion therapy. Discussed medication overuse headache and to limit use of acute treatments to no more than 2 days/week or 10 days/month. Discussed medication side effects, adverse reactions and drug interactions. Written educational materials and patient instructions outlining all of the above were given.  Genia Harold, MD

## 2022-05-25 ENCOUNTER — Ambulatory Visit: Payer: BC Managed Care – PPO | Admitting: Psychiatry

## 2022-05-25 ENCOUNTER — Encounter: Payer: Self-pay | Admitting: Psychiatry
